# Patient Record
Sex: Male | Born: 1940 | Race: White | Hispanic: No | Smoking: Never smoker
Health system: Southern US, Community
[De-identification: ages and names within clinical notes are randomized; demographics above are authoritative.]

## PROBLEM LIST (undated history)

## (undated) DIAGNOSIS — D509 Iron deficiency anemia, unspecified: Secondary | ICD-10-CM

## (undated) DIAGNOSIS — R399 Unspecified symptoms and signs involving the genitourinary system: Secondary | ICD-10-CM

## (undated) DIAGNOSIS — IMO0001 Reserved for inherently not codable concepts without codable children: Secondary | ICD-10-CM

## (undated) DIAGNOSIS — L299 Pruritus, unspecified: Secondary | ICD-10-CM

## (undated) DIAGNOSIS — R310 Gross hematuria: Secondary | ICD-10-CM

## (undated) DIAGNOSIS — G250 Essential tremor: Secondary | ICD-10-CM

## (undated) DIAGNOSIS — R51 Headache: Secondary | ICD-10-CM

## (undated) DIAGNOSIS — K5730 Diverticulosis of large intestine without perforation or abscess without bleeding: Secondary | ICD-10-CM

## (undated) DIAGNOSIS — N200 Calculus of kidney: Secondary | ICD-10-CM

## (undated) DIAGNOSIS — D649 Anemia, unspecified: Secondary | ICD-10-CM

## (undated) DIAGNOSIS — N400 Enlarged prostate without lower urinary tract symptoms: Secondary | ICD-10-CM

## (undated) DIAGNOSIS — R300 Dysuria: Secondary | ICD-10-CM

## (undated) DIAGNOSIS — N3020 Other chronic cystitis without hematuria: Secondary | ICD-10-CM

## (undated) DIAGNOSIS — R519 Headache, unspecified: Secondary | ICD-10-CM

## (undated) DIAGNOSIS — G629 Polyneuropathy, unspecified: Secondary | ICD-10-CM

## (undated) DIAGNOSIS — I4510 Unspecified right bundle-branch block: Secondary | ICD-10-CM

## (undated) DIAGNOSIS — Z8719 Personal history of other diseases of the digestive system: Secondary | ICD-10-CM

## (undated) DIAGNOSIS — E119 Type 2 diabetes mellitus without complications: Secondary | ICD-10-CM

## (undated) DIAGNOSIS — N529 Male erectile dysfunction, unspecified: Secondary | ICD-10-CM

## (undated) DIAGNOSIS — Z8739 Personal history of other diseases of the musculoskeletal system and connective tissue: Secondary | ICD-10-CM

## (undated) DIAGNOSIS — N321 Vesicointestinal fistula: Secondary | ICD-10-CM

## (undated) DIAGNOSIS — E785 Hyperlipidemia, unspecified: Secondary | ICD-10-CM

## (undated) DIAGNOSIS — I10 Essential (primary) hypertension: Secondary | ICD-10-CM

## (undated) DIAGNOSIS — K219 Gastro-esophageal reflux disease without esophagitis: Secondary | ICD-10-CM

## (undated) DIAGNOSIS — Z9189 Other specified personal risk factors, not elsewhere classified: Secondary | ICD-10-CM

## (undated) HISTORY — DX: Benign prostatic hyperplasia without lower urinary tract symptoms: N40.0

## (undated) HISTORY — DX: Male erectile dysfunction, unspecified: N52.9

## (undated) HISTORY — DX: Other chronic cystitis without hematuria: N30.20

## (undated) HISTORY — DX: Gastro-esophageal reflux disease without esophagitis: K21.9

## (undated) HISTORY — DX: Reserved for inherently not codable concepts without codable children: IMO0001

## (undated) HISTORY — DX: Polyneuropathy, unspecified: G62.9

## (undated) HISTORY — DX: Iron deficiency anemia, unspecified: D50.9

## (undated) HISTORY — DX: Essential (primary) hypertension: I10

## (undated) HISTORY — DX: Calculus of kidney: N20.0

## (undated) HISTORY — PX: CORNEAL TRANSPLANT: SHX108

---
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
## 1961-03-31 HISTORY — PX: EYE SURGERY: SHX253

## 1986-03-31 HISTORY — PX: OTHER SURGICAL HISTORY: SHX169

## 1999-03-18 ENCOUNTER — Encounter: Admission: RE | Admit: 1999-03-18 | Discharge: 1999-03-18 | Payer: Self-pay | Admitting: Family Medicine

## 1999-03-18 ENCOUNTER — Encounter: Payer: Self-pay | Admitting: Family Medicine

## 2000-02-01 ENCOUNTER — Encounter: Payer: Self-pay | Admitting: Emergency Medicine

## 2000-02-01 ENCOUNTER — Emergency Department (HOSPITAL_COMMUNITY): Admission: EM | Admit: 2000-02-01 | Discharge: 2000-02-02 | Payer: Self-pay | Admitting: Emergency Medicine

## 2000-02-14 ENCOUNTER — Encounter: Admission: RE | Admit: 2000-02-14 | Discharge: 2000-02-14 | Payer: Self-pay | Admitting: Urology

## 2000-02-14 ENCOUNTER — Encounter: Payer: Self-pay | Admitting: Urology

## 2000-02-27 ENCOUNTER — Encounter: Admission: RE | Admit: 2000-02-27 | Discharge: 2000-02-27 | Payer: Self-pay | Admitting: Urology

## 2000-02-27 ENCOUNTER — Encounter: Payer: Self-pay | Admitting: Urology

## 2000-03-03 ENCOUNTER — Encounter: Admission: RE | Admit: 2000-03-03 | Discharge: 2000-03-03 | Payer: Self-pay | Admitting: Urology

## 2000-03-03 ENCOUNTER — Encounter: Payer: Self-pay | Admitting: Urology

## 2002-12-29 ENCOUNTER — Encounter (INDEPENDENT_AMBULATORY_CARE_PROVIDER_SITE_OTHER): Payer: Self-pay | Admitting: *Deleted

## 2003-04-01 HISTORY — PX: ROTATOR CUFF REPAIR: SHX139

## 2003-11-09 ENCOUNTER — Encounter: Admission: RE | Admit: 2003-11-09 | Discharge: 2003-11-09 | Payer: Self-pay | Admitting: Family Medicine

## 2007-09-10 ENCOUNTER — Encounter: Admission: RE | Admit: 2007-09-10 | Discharge: 2007-09-10 | Payer: Self-pay | Admitting: Family Medicine

## 2009-06-22 ENCOUNTER — Inpatient Hospital Stay (HOSPITAL_COMMUNITY): Admission: EM | Admit: 2009-06-22 | Discharge: 2009-06-30 | Payer: Self-pay | Admitting: Emergency Medicine

## 2009-06-22 ENCOUNTER — Encounter (INDEPENDENT_AMBULATORY_CARE_PROVIDER_SITE_OTHER): Payer: Self-pay | Admitting: *Deleted

## 2009-06-22 ENCOUNTER — Encounter: Admission: RE | Admit: 2009-06-22 | Discharge: 2009-06-22 | Payer: Self-pay | Admitting: Internal Medicine

## 2009-06-28 ENCOUNTER — Encounter (INDEPENDENT_AMBULATORY_CARE_PROVIDER_SITE_OTHER): Payer: Self-pay | Admitting: *Deleted

## 2009-07-04 ENCOUNTER — Ambulatory Visit (HOSPITAL_COMMUNITY): Admission: RE | Admit: 2009-07-04 | Discharge: 2009-07-04 | Payer: Self-pay | Admitting: Surgery

## 2009-07-04 ENCOUNTER — Encounter (INDEPENDENT_AMBULATORY_CARE_PROVIDER_SITE_OTHER): Payer: Self-pay | Admitting: *Deleted

## 2009-07-17 ENCOUNTER — Telehealth: Payer: Self-pay | Admitting: Internal Medicine

## 2009-07-23 ENCOUNTER — Encounter (INDEPENDENT_AMBULATORY_CARE_PROVIDER_SITE_OTHER): Payer: Self-pay | Admitting: *Deleted

## 2009-07-25 ENCOUNTER — Encounter (INDEPENDENT_AMBULATORY_CARE_PROVIDER_SITE_OTHER): Payer: Self-pay | Admitting: *Deleted

## 2009-07-25 ENCOUNTER — Ambulatory Visit: Payer: Self-pay | Admitting: Internal Medicine

## 2009-08-03 ENCOUNTER — Ambulatory Visit: Payer: Self-pay | Admitting: Internal Medicine

## 2009-08-14 ENCOUNTER — Telehealth: Payer: Self-pay | Admitting: Internal Medicine

## 2009-08-30 ENCOUNTER — Encounter: Payer: Self-pay | Admitting: Internal Medicine

## 2009-12-04 ENCOUNTER — Encounter: Payer: Self-pay | Admitting: Internal Medicine

## 2009-12-06 ENCOUNTER — Ambulatory Visit (HOSPITAL_COMMUNITY): Admission: RE | Admit: 2009-12-06 | Discharge: 2009-12-06 | Payer: Self-pay | Admitting: Surgery

## 2010-05-02 NOTE — Discharge Summary (Signed)
Summary: Scott Nixon 3/25- 4/2 Abdominal Abcess   NAME:  Scott Nixon, Scott Nixon                ACCOUNT NO.:  [id number]      MEDICAL RECORD NO.:  [id number]          PATIENT TYPE:  INP      LOCATION:  5128                         FACILITY:  [facility]      PHYSICIAN:  Currie Paris, M.D.DATE OF BIRTH:  1940/05/26      DATE OF ADMISSION:  06/22/2009   DATE OF DISCHARGE:  06/30/2009                                  DISCHARGE SUMMARY      HISTORY OF PRESENT ILLNESS:  [name] is a pleasant 70 year old male   who presented with abdominal pain over the course of several days.  His   workup included a CT scan, which apparently showed evidence of   diverticular disease with an air-fluid pocket on the right side of the   abdomen consistent with possibly a sealed perforation.  Decision was   made to admit the patient for nonoperative observation with the   indication for possible surgical intervention if necessary.      SUMMARY OF HOSPITAL COURSE:  The patient was admitted on June 22, 2009,   was started on IV antibiotics.  He responded well and showed almost   immediate improvement of his abdominal pain over the first 24 hours.  He   was started on clear liquid diet, which gradually advanced to full   liquid diet.  His white blood cell count normalized after 48 hours of IV   antibiotics.  However, he persisted to have some right-sided abdominal   pain.  A repeat CT scan was ordered and performed on the June 27, 2009,   which showed an area adjacent to the cecum consistent with an apparent   abscess.  It did appear to be a little bit worse than his original   admission CT, and decision was made to percutaneously drained that   abscess.  The patient successfully underwent percutaneous drainage by   Interventional Radiology.  He was advanced to a low residue diet and   tolerated that well without increased pain.  His white blood cell count   remained normal.  At this point, we felt the patient was  stable for   discharge home with his drain.  He would go home on p.o. antibiotics   consisting of Augmentin and Flagyl and would follow up in our clinic in   approximately 1-2 weeks' time to see Dr. Jamey Nixon.      DISCHARGE DIAGNOSES:  Intra-abdominal abscess secondary to probable   perforated diverticulum versus though less likely possible appendix.   The patient does have significant diverticular disease, and it is felt   to favor that.      DISCHARGE MEDICATIONS:  Cipro and Flagyl 500 mg each, Cipro twice daily,   Flagyl 3 times daily for a minimum of 10 days.  We will also resume all   of his home medications as noted on the medication reconciliation.  He   will follow up with Dr. Jamey Nixon in about approximately 7-10 days time.  Scott El, PA-C         ______________________________   Currie Paris, M.D.         KB/MEDQ  D:  07/05/2009  T:  07/06/2009  Job:  [id number]      Electronically Signed by Scott Nixon  on 07/06/2009 10:53:22 AM   Electronically Signed by Cyndia Bent M.D. on 07/10/2009 12:08:37 PM

## 2010-05-02 NOTE — Procedures (Signed)
Summary: Colonoscopy  Victorino Dike)   Colonoscopy  Procedure date:  12/29/2002  Findings:      Results: Diverticulosis.       Location:  [facility].    Colonoscopy  Procedure date:  12/29/2002  Findings:      Results: Diverticulosis.       [facility]   [facility]   Patient Name: Donte, Lenzo MRN:  Procedure Procedures: Colonoscopy CPT: 04540.  Personnel: Endoscopist: Ulyess Mort, MD.  Referred By: Margrett Rud, MD.  Exam Location: Exam performed in Outpatient Clinic. Outpatient  Patient Consent: Procedure, Alternatives, Risks and Benefits discussed, consent obtained, from patient. Consent was obtained by the RN.  Indications Symptoms: Abdominal pain / bloating.  History  Pre-Exam Physical: Entire physical exam was normal.  Exam Exam: Extent of exam reached: Cecum, extent intended: Cecum.  The cecum was identified by IC valve. Patient position: on left side. Colon retroflexion performed. ASA Classification: II.  Monitoring: Pulse and BP monitoring, Oximetry used. Supplemental O2 given.  Colon Prep Used Golytely for colon prep.  Sedation Meds: Patient assessed and found to be appropriate for moderate (conscious) sedation. Fentanyl given IV. Versed given IV.  Findings - DIVERTICULOSIS: Hepatic Flexure to Sigmoid Colon. ICD9: Diverticulosis: 562.10. Comments: mild --moderate.   Assessment Abnormal examination, see findings above.  Diagnoses: 562.10: Diverticulosis.   Events  Unplanned Interventions: No intervention was required.  Unplanned Events: There were no complications. Plans Medication Plan: Continue current medications.  Patient Education: Patient given standard instructions for: Diverticulosis. Yearly hemoccult testing recommended. Patient instructed to get routine colonoscopy every 5 years.  Disposition: After procedure patient sent to recovery. After recovery patient sent home.    This report  was created from the original endoscopy report, which was reviewed and signed by the above listed endoscopist.

## 2010-05-02 NOTE — Miscellaneous (Signed)
Summary: [facility] Previsit/prep  Clinical Lists Changes  Medications: Added new medication of MOVIPREP 100 GM  SOLR (PEG-KCL-NACL-NASULF-NA ASC-C) As per prep instructions. - Signed Rx of MOVIPREP 100 GM  SOLR (PEG-KCL-NACL-NASULF-NA ASC-C) As per prep instructions.;  #1 x 0;  Signed;  Entered by: [name] RN;  Authorized by: Iva Boop MD, [facility];  Method used: Electronically to [facility] [address] [place], [place]  [address], Ph: [phone], Fax: [phone] Observations: Added new observation of NKA: T (07/25/2009 8:15)    Prescriptions: MOVIPREP 100 GM  SOLR (PEG-KCL-NACL-NASULF-NA ASC-C) As per prep instructions.  #1 x 0   Entered by:   [name] RN   Authorized by:   Iva Boop MD, [facility]   Signed by:   [name] RN on 07/25/2009   Method used:   Electronically to        [facility].* (retail)       [address]       [place], [place]  [address]       Ph: [phone]       Fax: [phone]   RxID:   [id number]

## 2010-05-02 NOTE — Letter (Signed)
Summary: Diabetic Instructions  [facility]  [address] [place], [place] [address]   Phone: [phone]  Fax: [phone]    Scott Nixon 01/30/1941 MRN: [id number]   _ x _   ORAL DIABETIC MEDICATION INSTRUCTIONS  The day before your procedure:   Take your diabetic pill as you do normally  The day of your procedure:   Do not take your diabetic pill    We will check your blood sugar levels during the admission process and again in Recovery before discharging you home  ________________________________________________________________________

## 2010-05-02 NOTE — Procedures (Signed)
Summary: Colonoscopy  Patient: [name] Note: All result statuses are Final unless otherwise noted.  Tests: (1) Colonoscopy (COL)   COL Colonoscopy           DONE      [facility]     [address]     [place], [place]  [address]           COLONOSCOPY PROCEDURE REPORT           PATIENT:  Scott Nixon, Scott Nixon  MR#:  [id number]     BIRTHDATE:  Jan 03, 1941, 69 yrs. old  GENDER:  male     ENDOSCOPIST:  Iva Boop, MD, [facility]     REF. BY:  Cyndia Bent, M.D.     PROCEDURE DATE:  08/03/2009     PROCEDURE:  Colonoscopy with biopsy     ASA CLASS:  Class II     INDICATIONS:  abnormal CT of abdomen recent RLQ abscess with     spontaneous perforation, treated and healed with anibiotics and     drain     MEDICATIONS:   Fentanyl 75 mcg IV, Versed 8 mg IV           DESCRIPTION OF PROCEDURE:   After the risks benefits and     alternatives of the procedure were thoroughly explained, informed     consent was obtained.  Digital rectal exam was performed and     revealed no abnormalities and normal prostate.   The LB CF-H180AL     [id number] endoscope was introduced through the anus and advanced to     the cecum, which was identified by both the appendix and ileocecal     valve, without limitations.  The quality of the prep was     excellent, using MoviPrep.  The instrument was then slowly     withdrawn as the colon was fully examined.     Insertion: 4:32 minutes Withdrawal: 10:29 minutes     <<PROCEDUREIMAGES>>           FINDINGS:  Submucosal lesion in the cecum. Bulging mucosa (soft     and indentable) in cecum near appendix with smaller, firm     submucosal bulge (1cm) associated. Multiple biopsies were obtained     and sent to pathology. Tunnel biopsies x 4 of firm bulge.  Severe     diverticulosis was found in the left colon.  This was otherwise a     normal examination of the colon. Could see IC valve but distorted     cecum did not allow identification of ileal orifice.    Retroflexed     views in the rectum revealed internal hemorrhoids.    The scope     was then withdrawn from the patient and the procedure completed.           COMPLICATIONS:  None     ENDOSCOPIC IMPRESSION:     1) Submucosal lesion in cecum near appendix  with one cm firm     area on top - biopsiesd     2) Severe diverticulosis in the left colon     3) Internal hemorrhoids     4) Otherwise normal examination, excellent prep     RECOMMENDATIONS:     1) Await biopsy results     REPEAT EXAM:  In for Colonoscopy, pending biopsy results.           Iva Boop, MD, Clementeen Graham  CC:  Cyndia Bent, MD     Merlene Laughter, MD     The Patient           n.     eSIGNED:   Iva Boop at 08/03/2009 11:53 AM           Glynn Octave, [id number]  Note: An exclamation mark (!) indicates a result that was not dispersed into the flowsheet. Document Creation Date: 08/03/2009 11:53 AM _______________________________________________________________________  (1) Order result status: Final Collection or observation date-time: 08/03/2009 11:40 Requested date-time:  Receipt date-time:  Reported date-time:  Referring Physician:   Ordering Physician: Stan Head [id number]) Specimen Source:  Source: Launa Grill Order Number: [id number] Lab site:   Appended Document: Colonoscopy     Procedures Next Due Date:    Colonoscopy: 07/2016

## 2010-05-02 NOTE — Initial Assessments (Signed)
Summary: [facility] - Abdominal Abcess  NAME:  Scott Nixon, Scott Nixon                ACCOUNT NO.:  [id number]      MEDICAL RECORD NO.:  [id number]          PATIENT TYPE:  INP      LOCATION:  5127                         FACILITY:  [facility]      PHYSICIAN:  [name], MD     DATE OF BIRTH:  16-May-1940      DATE OF ADMISSION:  06/22/2009   DATE OF DISCHARGE:                                 HISTORY & PHYSICAL      PRIMARY CARE PHYSICIAN:  Hal T. Stoneking, MD      CHIEF COMPLAINTS:  Abdominal pain.      HISTORY OF PRESENT ILLNESS:  Scott Nixon is a 70 year old white male with   a history of diabetes mellitus and hypertension, who developed abdominal   pain this last Sunday on [place] way [place] the beach.  The patient did develop   some nausea on Sunday but has not had any since then.  On Monday, [place]   [place] he was   diagnosed with diverticulitis and placed on Cipro and Flagyl.  At that   time, the patient was told that if there are beds available that they   would have admitted him, but due to lack of beds, he was sent home.  The   patient continued to stay at the beach despite worsening pain and   returned home on Wednesday.  He was seen by Dr. Nehemiah Settle in the office on   Thursday and sent for a CT scan.  The CT scan was performed today as he   got to the outpatient Radiology facility to wait yesterday.  Today, CT   scan revealed pneumoperitoneum with a large area of free air near the   sigmoid colon with an abscess developing over the dome of the bladder.   Due to these findings, we were asked to evaluate the patient for   admission.      REVIEW OF SYSTEMS:  Please see HPI, otherwise, currently all other   systems are negative.      PAST MEDICAL HISTORY:   1. Diabetes mellitus.   2. Hypertension.      FAMILY HISTORY:  Noncontributory.      PAST SURGICAL HISTORY:   1. Appendectomy.   2. Right inguinal hernia repair.   3. Eye surgery.        SOCIAL HISTORY:  The patient has a significant other who I presume is   like his common-law wife as they are not married but have been together   for the last 15 years.  He denies tobacco or alcohol.  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      ALLERGIES:  No known drug allergies.      MEDICATIONS:  The patient is unaware of his medication doses; however,   recently, he has been on:   1. Cipro 500 mg 1 p.o. b.i.d.   2. Flagyl 500 mg 1 p.o. t.i.d.  3. Metformin dose unknown.   4. Flomax dose unknown.   5. Zetia dose unknown.   6. Blood pressure medicines for which he does not know the name.       However, his wife is supposed to bring back his medications on her       way back to the hospital from her house.      PHYSICAL EXAMINATION:  GENERAL:  Scott Nixon is a very pleasant 69-year-   old obese white male who is currently lying in bed, in no acute   distress.   VITAL SIGNS:  Temperature 97.7, pulse 73, respirations 22, blood   pressure 119/75.   HEENT:  Head is normocephalic and atraumatic.  Sclerae noninjected.   Pupils are equal, round, and reactive to light.  Ears and nose without   any obvious masses or lesions.  No rhinorrhea.  Mouth is pink and moist.   Throat shows no exudate.   HEART:  Regular rate and rhythm.  Normal S1 and S2.  No murmurs,   gallops, or rubs are noted.  A +2 carotid, radial, and pedal pulses   bilaterally.   LUNGS:  Clear to auscultation bilaterally with no wheezes, rhonchi, or   rales noted.  Respiratory effort is nonlabored.   ABDOMEN:  Soft with minimal tenderness throughout.  He does have active   bowel sounds and is somewhat distended.  Currently, he does not have any   peritoneal signs, guarding, or rebounding noted.  He does not have any   masses, hernias, or organomegaly noted.   MUSCULOSKELETAL:  All 4 extremities are symmetrical with no cyanosis,   clubbing, or edema.   NEUROLOGIC:  Cranial nerves  II through XII appear to be grossly intact.   Deep tendon reflex exam is deferred at this time.   PSYCH:  The patient is alert and oriented x3 with an appropriate affect.      LABORATORY DATA AND DIAGNOSTICS:  White blood cell count is 10,800,   currently a CMET is pending.   CT scan of the abdomen and pelvis reveals pneumoperitoneum with a large   pocket of air next to his sigmoid colon with multiple diverticula noted,   consistent with diverticulitis.  He does have an abscess that has formed   at the dome of the bladder.  However, it appears the patient may have   sealed his perforation as there is currently no contrast extravasation.      IMPRESSION:   1. Perforated diverticulitis with pneumoperitoneum and abscess       formation.   2. Diabetes mellitus.   3. Hypertension.      PLAN:  At this time, we will admit the patient and follow closely due to   his significant CT scan findings.  However, currently, the patient is in   no acute distress and appears he may have sealed his perforation off.   In the meantime, the patient will be placed on Invanz, as well as   various p.r.n. medications as needed for pain and nausea.  We will   follow him closely with serial exams, repeat labs in the morning.  The   hope due to the patient's debility will be to treat him conservatively;   however, with this patient, there is a low threshold for surgical   intervention due to his significant CT scan findings.               Letha Cape, PA  ______________________________   [name], MD         KEO/MEDQ  D:  06/22/2009  T:  06/23/2009  Job:  [id number]      cc:   Deirdre Peer. Polite, M.D.   Hal T. Stoneking, M.D.

## 2010-05-02 NOTE — Progress Notes (Signed)
Summary: call to Dr. Jamey Ripa and patient re: colonoscopy results  Phone Note Outgoing Call   Call placed by: Iva Boop MD, Clementeen Graham,  Aug 14, 2009 1:28 PM Summary of Call: reviewed colonoscopy findings with Dr. Jamey Ripa 1) We both think unlikely that cancer present 2) Dr. Jamey Ripa will see the patient in follow-up and consider repeat imaging, if needed 3) please ensure photos get to Dr. Jamey Ripa 4) Remind patient to schedule f/u appt with Dr. Jamey Ripa  Follow-up for Phone Call        Patient  has a follow up appointment wiht Dr Jamey Ripa 08/30/09.  he is aware of the appointment and results. Follow-up by: Darcey Nora RN, CGRN,  Aug 14, 2009 2:38 PM

## 2010-05-02 NOTE — Letter (Signed)
Summary: [facility]   Imported By: Lester Glenside 12/14/2009 08:47:03  _____________________________________________________________________  External Attachment:    Type:   Image     Comment:   External Document

## 2010-05-02 NOTE — Progress Notes (Signed)
Summary: discuss pt  Phone Note From Other Clinic Call back at [phone]   Caller: Dr. Jamey Ripa Call For: Dr. Leone Payor Summary of Call: would like pt to have COL, but has some GI health issues that Dr. Jamey Ripa would like to discuss with Dr. Leone Payor before sch'ling pt... Dr. Jamey Ripa will be available until 3:30PM at listed number Initial call taken by: Vallarie Mare,  July 17, 2009 11:05 AM  Follow-up for Phone Call        I called office but did not reach him yet rlq abscess, had been dx with perf in [facility] and sent back to [place] now recovered from abscess and not clear what initial event was Dr. Jamey Ripa and I think he needs a colonoscopy he may not need an office visit we will look for scans and hosp records then decide next step, also need paper chart Follow-up by: Iva Boop MD, Clementeen Graham,  July 19, 2009 9:18 AM  Additional Follow-up for Phone Call Additional follow up Details #1::        Paper chart ordered to your desk.  Hospital records copied into EMR Additional Follow-up by: Darcey Nora RN, CGRN,  July 19, 2009 9:36 AM    Additional Follow-up for Phone Call Additional follow up Details #2::    records reviewed I think he could be scheduled directly for a colonoscopy if he is ok with that otherwise rev me or extender first dx is 562.11 and 793.4 please copy/paste prior colonoscopy from cori Iva Boop MD, [facility]  July 20, 2009 3:42 PM   Additional Follow-up for Phone Call Additional follow up Details #3:: Details for Additional Follow-up Action Taken: Called pt, L/M to return call back to [place] Additional Follow-up by: Merri Ray CMA Duncan Dull),  July 23, 2009 9:58 AM  Patient  returned call he is scheduled to see pre-visit nurse 07/25/09 8:30 and colon [facility] 08-03-09 11:00 Darcey Nora RN, [facility]  July 23, 2009 2:48 PM

## 2010-05-02 NOTE — Letter (Signed)
Summary: [facility] Instructions  [facility]  [address] [place], [place] [address]   Phone: [phone]  Fax: [phone]       Scott Nixon    July 01, 1940    MRN: [id number]        Procedure Day /Date:  Friday 08/03/09     Arrival Time: 10:00 a.m.     Procedure Time: 11:00 a.m.     Location of Procedure:                    _x_  [facility] (4th Floor)                        PREPARATION FOR COLONOSCOPY WITH MOVIPREP   Starting 5 days prior to your procedure  07/29/09 do not eat nuts, seeds, popcorn, corn, beans, peas,  salads, or any raw vegetables.  Do not take any fiber supplements (e.g. Metamucil, Citrucel, and Benefiber).  THE DAY BEFORE YOUR PROCEDURE         DATE:  08/02/09  DAY:  Thursday  1.  Drink clear liquids the entire day-NO SOLID FOOD  2.  Do not drink anything colored red or purple.  Avoid juices with pulp.  No orange juice.  3.  Drink at least 64 oz. (8 glasses) of fluid/clear liquids during the day to prevent dehydration and help the prep work efficiently.  CLEAR LIQUIDS INCLUDE: Water Jello Ice Popsicles Tea (sugar ok, no milk/cream) Powdered fruit flavored drinks Coffee (sugar ok, no milk/cream) Gatorade Juice: apple, white grape, white cranberry  Lemonade Clear bullion, consomm, broth Carbonated beverages (any kind) Strained chicken noodle soup Hard Candy                             4.  In the morning, mix first dose of MoviPrep solution:    Empty 1 Pouch A and 1 Pouch B into the disposable container    Add lukewarm drinking water to the top line of the container. Mix to dissolve    Refrigerate (mixed solution should be used within 24 hrs)  5.  Begin drinking the prep at 5:00 p.m. The MoviPrep container is divided by 4 marks.   Every 15 minutes drink the solution down to the next mark (approximately 8 oz) until the full liter is complete.   6.  Follow completed prep with 16 oz of clear liquid of your choice  (Nothing red or purple).  Continue to drink clear liquids until bedtime.  7.  Before going to bed, mix second dose of MoviPrep solution:    Empty 1 Pouch A and 1 Pouch B into the disposable container    Add lukewarm drinking water to the top line of the container. Mix to dissolve    Refrigerate  THE DAY OF YOUR PROCEDURE      DATE:  08/03/09  DAY:  Friday  Beginning at  6:00 a.m. (5 hours before procedure):         1. Every 15 minutes, drink the solution down to the next mark (approx 8 oz) until the full liter is complete.  2. Follow completed prep with 16 oz. of clear liquid of your choice.    3. You may drink clear liquids until  9:00 a.m. (2 HOURS BEFORE PROCEDURE).   MEDICATION INSTRUCTIONS  Unless otherwise instructed, you should take regular prescription medications with a small sip of water  as early as possible the morning of your procedure.   Additional medication instructions:   Take blood pressure medicine the morning of procedure.         OTHER INSTRUCTIONS  You will need a responsible adult at least 70 years of age to accompany you and drive you home.   This person must remain in the waiting room during your procedure.  Wear loose fitting clothing that is easily removed.  Leave jewelry and other valuables at home.  However, you may wish to bring a book to read or  an iPod/MP3 player to listen to music as you wait for your procedure to start.  Remove all body piercing jewelry and leave at home.  Total time from sign-in until discharge is approximately 2-3 hours.  You should go home directly after your procedure and rest.  You can resume normal activities the  day after your procedure.  The day of your procedure you should not:   Drive   Make legal decisions   Operate machinery   Drink alcohol   Return to work  You will receive specific instructions about eating, activities and medications before you leave.    The above instructions have  been reviewed and explained to me by   [name] RN  July 25, 2009 8:53 AM     I fully understand and can verbalize these instructions _____________________________ Date _________

## 2010-05-03 NOTE — Letter (Signed)
Summary: [facility]   Imported By: Sherian Rein 01/16/2010 11:48:23  _____________________________________________________________________  External Attachment:    Type:   Image     Comment:   External Document

## 2010-05-03 NOTE — Letter (Signed)
Summary: [facility]   Imported By: Lester Erick 09/18/2009 09:11:11  _____________________________________________________________________  External Attachment:    Type:   Image     Comment:   External Document

## 2010-06-18 LAB — GLUCOSE, CAPILLARY: Glucose-Capillary: 104 mg/dL — ABNORMAL HIGH (ref 70–99)

## 2010-06-19 LAB — GLUCOSE, CAPILLARY
Glucose-Capillary: 106 mg/dL — ABNORMAL HIGH (ref 70–99)
Glucose-Capillary: 112 mg/dL — ABNORMAL HIGH (ref 70–99)
Glucose-Capillary: 88 mg/dL (ref 70–99)
Glucose-Capillary: 97 mg/dL (ref 70–99)

## 2010-06-19 LAB — CBC
Hemoglobin: 10.2 g/dL — ABNORMAL LOW (ref 13.0–17.0)
MCHC: 33.1 g/dL (ref 30.0–36.0)
MCV: 81.9 fL (ref 78.0–100.0)
Platelets: 383 10*3/uL (ref 150–400)
RDW: 17.9 % — ABNORMAL HIGH (ref 11.5–15.5)

## 2010-06-21 LAB — DIFFERENTIAL
Basophils Absolute: 0 10*3/uL (ref 0.0–0.1)
Monocytes Absolute: 0.6 10*3/uL (ref 0.1–1.0)
Monocytes Relative: 5 % (ref 3–12)
Neutro Abs: 8.5 10*3/uL — ABNORMAL HIGH (ref 1.7–7.7)

## 2010-06-21 LAB — URINALYSIS, ROUTINE W REFLEX MICROSCOPIC
Bilirubin Urine: NEGATIVE
Glucose, UA: NEGATIVE mg/dL
Hgb urine dipstick: NEGATIVE
Ketones, ur: NEGATIVE mg/dL
Nitrite: NEGATIVE
Protein, ur: NEGATIVE mg/dL
pH: 5 (ref 5.0–8.0)

## 2010-06-21 LAB — BASIC METABOLIC PANEL
CO2: 25 mEq/L (ref 19–32)
CO2: 26 mEq/L (ref 19–32)
Calcium: 7.8 mg/dL — ABNORMAL LOW (ref 8.4–10.5)
Calcium: 8.4 mg/dL (ref 8.4–10.5)
Chloride: 104 mEq/L (ref 96–112)
Creatinine, Ser: 1.01 mg/dL (ref 0.4–1.5)
GFR calc Af Amer: >60 mL/min (ref >60)
GFR calc Af Amer: >60 mL/min (ref >60)
Potassium: 4.3 mEq/L (ref 3.5–5.1)
Sodium: 136 mEq/L (ref 135–145)

## 2010-06-21 LAB — GLUCOSE, CAPILLARY
Glucose-Capillary: 103 mg/dL — ABNORMAL HIGH (ref 70–99)
Glucose-Capillary: 104 mg/dL — ABNORMAL HIGH (ref 70–99)
Glucose-Capillary: 104 mg/dL — ABNORMAL HIGH (ref 70–99)
Glucose-Capillary: 106 mg/dL — ABNORMAL HIGH (ref 70–99)
Glucose-Capillary: 108 mg/dL — ABNORMAL HIGH (ref 70–99)
Glucose-Capillary: 109 mg/dL — ABNORMAL HIGH (ref 70–99)
Glucose-Capillary: 110 mg/dL — ABNORMAL HIGH (ref 70–99)
Glucose-Capillary: 111 mg/dL — ABNORMAL HIGH (ref 70–99)
Glucose-Capillary: 113 mg/dL — ABNORMAL HIGH (ref 70–99)
Glucose-Capillary: 115 mg/dL — ABNORMAL HIGH (ref 70–99)
Glucose-Capillary: 122 mg/dL — ABNORMAL HIGH (ref 70–99)
Glucose-Capillary: 123 mg/dL — ABNORMAL HIGH (ref 70–99)
Glucose-Capillary: 130 mg/dL — ABNORMAL HIGH (ref 70–99)
Glucose-Capillary: 132 mg/dL — ABNORMAL HIGH (ref 70–99)
Glucose-Capillary: 134 mg/dL — ABNORMAL HIGH (ref 70–99)
Glucose-Capillary: 161 mg/dL — ABNORMAL HIGH (ref 70–99)
Glucose-Capillary: 250 mg/dL — ABNORMAL HIGH (ref 70–99)
Glucose-Capillary: 91 mg/dL (ref 70–99)

## 2010-06-21 LAB — CREATININE, SERUM
Creatinine, Ser: 0.99 mg/dL (ref 0.4–1.5)
GFR calc Af Amer: >60 mL/min (ref >60)

## 2010-06-21 LAB — CBC
HCT: 30.9 % — ABNORMAL LOW (ref 39.0–52.0)
HCT: 31.7 % — ABNORMAL LOW (ref 39.0–52.0)
Hemoglobin: 10.5 g/dL — ABNORMAL LOW (ref 13.0–17.0)
MCHC: 33.1 g/dL (ref 30.0–36.0)
MCHC: 33.6 g/dL (ref 30.0–36.0)
MCHC: 33.8 g/dL (ref 30.0–36.0)
MCHC: 33.9 g/dL (ref 30.0–36.0)
MCV: 80.7 fL (ref 78.0–100.0)
MCV: 80.8 fL (ref 78.0–100.0)
MCV: 81.8 fL (ref 78.0–100.0)
Platelets: 300 10*3/uL (ref 150–400)
RBC: 3.89 MIL/uL — ABNORMAL LOW (ref 4.22–5.81)
RBC: 3.89 MIL/uL — ABNORMAL LOW (ref 4.22–5.81)
RBC: 3.93 MIL/uL — ABNORMAL LOW (ref 4.22–5.81)
RDW: 17.7 % — ABNORMAL HIGH (ref 11.5–15.5)
RDW: 17.7 % — ABNORMAL HIGH (ref 11.5–15.5)
RDW: 17.9 % — ABNORMAL HIGH (ref 11.5–15.5)
WBC: 10.8 10*3/uL — ABNORMAL HIGH (ref 4.0–10.5)
WBC: 7.4 10*3/uL (ref 4.0–10.5)

## 2010-06-21 LAB — ANAEROBIC CULTURE

## 2010-06-21 LAB — COMPREHENSIVE METABOLIC PANEL
ALT: 28 U/L (ref 0–53)
AST: 36 U/L (ref 0–37)
Albumin: 2.9 g/dL — ABNORMAL LOW (ref 3.5–5.2)
BUN: 16 mg/dL (ref 6–23)
Calcium: 8.2 mg/dL — ABNORMAL LOW (ref 8.4–10.5)
Chloride: 100 mEq/L (ref 96–112)
Creatinine, Ser: 1.12 mg/dL (ref 0.4–1.5)
GFR calc non Af Amer: >60 mL/min (ref >60)
Potassium: 3.6 mEq/L (ref 3.5–5.1)
Total Protein: 7 g/dL (ref 6.0–8.3)

## 2010-06-21 LAB — PROTIME-INR: Prothrombin Time: 13.7 seconds (ref 11.6–15.2)

## 2010-06-21 LAB — APTT: aPTT: 30 seconds (ref 24–37)

## 2010-06-21 LAB — HEMOGLOBIN A1C: Mean Plasma Glucose: 134 mg/dL

## 2013-02-17 DIAGNOSIS — H02839 Dermatochalasis of unspecified eye, unspecified eyelid: Secondary | ICD-10-CM | POA: Insufficient documentation

## 2013-02-17 DIAGNOSIS — H02059 Trichiasis without entropian unspecified eye, unspecified eyelid: Secondary | ICD-10-CM | POA: Insufficient documentation

## 2013-02-17 DIAGNOSIS — H04559 Acquired stenosis of unspecified nasolacrimal duct: Secondary | ICD-10-CM | POA: Insufficient documentation

## 2013-02-17 DIAGNOSIS — H11239 Symblepharon, unspecified eye: Secondary | ICD-10-CM | POA: Insufficient documentation

## 2013-02-17 DIAGNOSIS — Z9889 Other specified postprocedural states: Secondary | ICD-10-CM | POA: Insufficient documentation

## 2013-05-02 DIAGNOSIS — G250 Essential tremor: Secondary | ICD-10-CM | POA: Diagnosis present

## 2013-05-02 DIAGNOSIS — I10 Essential (primary) hypertension: Secondary | ICD-10-CM | POA: Diagnosis present

## 2013-05-02 DIAGNOSIS — K219 Gastro-esophageal reflux disease without esophagitis: Secondary | ICD-10-CM | POA: Diagnosis present

## 2013-05-02 DIAGNOSIS — E119 Type 2 diabetes mellitus without complications: Secondary | ICD-10-CM

## 2013-09-12 ENCOUNTER — Ambulatory Visit (INDEPENDENT_AMBULATORY_CARE_PROVIDER_SITE_OTHER): Payer: Medicare Other

## 2013-09-12 ENCOUNTER — Ambulatory Visit (INDEPENDENT_AMBULATORY_CARE_PROVIDER_SITE_OTHER): Payer: Medicare Other | Admitting: Podiatry

## 2013-09-12 ENCOUNTER — Encounter: Payer: Self-pay | Admitting: Podiatry

## 2013-09-12 DIAGNOSIS — R52 Pain, unspecified: Secondary | ICD-10-CM

## 2013-09-12 DIAGNOSIS — M109 Gout, unspecified: Secondary | ICD-10-CM

## 2013-09-12 DIAGNOSIS — R609 Edema, unspecified: Secondary | ICD-10-CM

## 2013-09-12 DIAGNOSIS — M779 Enthesopathy, unspecified: Secondary | ICD-10-CM

## 2013-09-12 MED ORDER — TRIAMCINOLONE ACETONIDE 10 MG/ML IJ SUSP
10.0000 mg | Freq: Once | INTRAMUSCULAR | Status: AC
  Administered 2013-09-12: 10 mg

## 2013-09-12 NOTE — Progress Notes (Signed)
° °  Subjective:    Patient ID: Scott Nixon, male    DOB: 10-13-1940, 73 y.o.   MRN: [id number]  HPI my left ankle has been hurting me since last Thursday and I could not walk and is sore and tender and is swelling and is throbbing and numbness and tingling    Review of Systems  Constitutional: Positive for unexpected weight change.  Eyes: Positive for itching and visual disturbance.  Genitourinary: Positive for difficulty urinating.  Musculoskeletal: Positive for gait problem.       Joint pain   Neurological: Positive for tremors.  All other systems reviewed and are negative.      Objective:   Physical Exam        Assessment & Plan:

## 2013-09-12 NOTE — Progress Notes (Signed)
Subjective:     Patient ID: Scott Nixon, male   DOB: 12-Nov-1940, 73 y.o.   MRN: [id number]  HPI patient states he [place] [facility] [place] [facility] [place] and that the pain swollen and painful and he had trouble walking on it afterwards. Points to the left ankle mostly on the lateral side   Review of Systems  All other systems reviewed and are negative.      Objective:   Physical Exam  Nursing note and vitals reviewed. Constitutional: He is oriented to person, place, and time.  Cardiovascular: Intact distal pulses.   Musculoskeletal: Normal range of motion.  Neurological: He is oriented to person, place, and time.  Skin: Skin is warm.   neurovascular found to be intact with patient having pain in the peroneal complex left with +2 pitting edema noted in the forefoot and also ankle left. Negative Homan sign was noted in digits are found to be well perfused and arch height within normal limits     Assessment:     Probable tendinitis with inflammatory changes but cannot rule out systemic disease    Plan:     H&P and x-ray reviewed and injected along the peroneal area 3 mg Kenalog 5 mg I can Marcaine mixture and placed in Unna boot in order to compress the foot

## 2013-09-19 ENCOUNTER — Encounter: Payer: Medicare Other | Admitting: Podiatry

## 2013-09-22 ENCOUNTER — Encounter: Payer: Self-pay | Admitting: Podiatry

## 2013-09-22 ENCOUNTER — Ambulatory Visit (INDEPENDENT_AMBULATORY_CARE_PROVIDER_SITE_OTHER): Payer: Medicare Other | Admitting: Podiatry

## 2013-09-22 VITALS — BP 155/81 | HR 56 | Resp 17 | Ht 71.0 in | Wt 150.0 lb

## 2013-09-22 DIAGNOSIS — M7750 Other enthesopathy of unspecified foot: Secondary | ICD-10-CM

## 2013-09-22 NOTE — Progress Notes (Signed)
Subjective:     Patient ID: Scott Nixon, male   DOB: [birth year], 73 y.o.   MRN: [id number]  HPI patient states that the pain around the left ankle has improved and that he is walking a lot better now   Review of Systems     Objective:   Physical Exam Neurovascular status intact no change in health history with discomfort around the. Medial insertion improved quite well from previous visit    Assessment:     Doing well after being treated for tendinitis of the left peroneal insertion fifth metatarsal base    Plan:     Instructed patient on condition and discussed physical therapy and supportive shoe gear usage at this time. Patient will be seen back to recheck if symptoms persist

## 2014-01-27 ENCOUNTER — Emergency Department (HOSPITAL_COMMUNITY)
Admission: EM | Admit: 2014-01-27 | Discharge: 2014-01-27 | Disposition: A | Discharge status: Home / Self Care | Payer: Medicare Other | Attending: Emergency Medicine | Admitting: Emergency Medicine

## 2014-01-27 ENCOUNTER — Encounter: Payer: Self-pay | Admitting: Physician Assistant

## 2014-01-27 ENCOUNTER — Encounter (HOSPITAL_COMMUNITY): Payer: Self-pay | Admitting: Radiology

## 2014-01-27 ENCOUNTER — Emergency Department (HOSPITAL_COMMUNITY): Payer: Medicare Other

## 2014-01-27 DIAGNOSIS — K632 Fistula of intestine: Secondary | ICD-10-CM | POA: Diagnosis not present

## 2014-01-27 DIAGNOSIS — K5792 Diverticulitis of intestine, part unspecified, without perforation or abscess without bleeding: Secondary | ICD-10-CM

## 2014-01-27 DIAGNOSIS — E119 Type 2 diabetes mellitus without complications: Secondary | ICD-10-CM | POA: Diagnosis not present

## 2014-01-27 DIAGNOSIS — R1033 Periumbilical pain: Secondary | ICD-10-CM | POA: Diagnosis present

## 2014-01-27 DIAGNOSIS — K5732 Diverticulitis of large intestine without perforation or abscess without bleeding: Secondary | ICD-10-CM | POA: Insufficient documentation

## 2014-01-27 DIAGNOSIS — Z7982 Long term (current) use of aspirin: Secondary | ICD-10-CM | POA: Insufficient documentation

## 2014-01-27 DIAGNOSIS — N321 Vesicointestinal fistula: Secondary | ICD-10-CM

## 2014-01-27 DIAGNOSIS — Z79899 Other long term (current) drug therapy: Secondary | ICD-10-CM | POA: Insufficient documentation

## 2014-01-27 DIAGNOSIS — R109 Unspecified abdominal pain: Secondary | ICD-10-CM

## 2014-01-27 LAB — CBC WITH DIFFERENTIAL/PLATELET
BASOS PCT: 0 % (ref 0–1)
Basophils Absolute: 0 10*3/uL (ref 0.0–0.1)
Eosinophils Absolute: 0.2 10*3/uL (ref 0.0–0.7)
Eosinophils Relative: 3 % (ref 0–5)
HCT: 36.9 % — ABNORMAL LOW (ref 39.0–52.0)
HEMOGLOBIN: 12.2 g/dL — AB (ref 13.0–17.0)
Lymphocytes Relative: 14 % (ref 12–46)
Lymphs Abs: 1.3 10*3/uL (ref 0.7–4.0)
MCH: 29 pg (ref 26.0–34.0)
MCHC: 33.1 g/dL (ref 30.0–36.0)
MCV: 87.9 fL (ref 78.0–100.0)
MONO ABS: 0.7 10*3/uL (ref 0.1–1.0)
MONOS PCT: 7 % (ref 3–12)
NEUTROS ABS: 6.9 10*3/uL (ref 1.7–7.7)
Neutrophils Relative %: 76 % (ref 43–77)
Platelets: 164 10*3/uL (ref 150–400)
RBC: 4.2 MIL/uL — ABNORMAL LOW (ref 4.22–5.81)
RDW: 15 % (ref 11.5–15.5)
WBC: 9.1 10*3/uL (ref 4.0–10.5)

## 2014-01-27 LAB — URINALYSIS, ROUTINE W REFLEX MICROSCOPIC
BILIRUBIN URINE: NEGATIVE
Glucose, UA: NEGATIVE mg/dL
KETONES UR: NEGATIVE mg/dL
Nitrite: NEGATIVE
Protein, ur: NEGATIVE mg/dL
SPECIFIC GRAVITY, URINE: 1.014 (ref 1.005–1.030)
UROBILINOGEN UA: 0.2 mg/dL (ref 0.0–1.0)
pH: 5.5 (ref 5.0–8.0)

## 2014-01-27 LAB — COMPREHENSIVE METABOLIC PANEL
ALBUMIN: 3.5 g/dL (ref 3.5–5.2)
ALT: 31 U/L (ref 0–53)
ANION GAP: 14 (ref 5–15)
AST: 26 U/L (ref 0–37)
Alkaline Phosphatase: 59 U/L (ref 39–117)
BILIRUBIN TOTAL: 0.8 mg/dL (ref 0.3–1.2)
BUN: 31 mg/dL — ABNORMAL HIGH (ref 6–23)
CHLORIDE: 107 meq/L (ref 96–112)
CO2: 19 mEq/L (ref 19–32)
CREATININE: 1.63 mg/dL — AB (ref 0.50–1.35)
Calcium: 9.1 mg/dL (ref 8.4–10.5)
GFR, EST AFRICAN AMERICAN: 47 mL/min — AB (ref >90)
GFR, EST NON AFRICAN AMERICAN: 40 mL/min — AB (ref >90)
Glucose, Bld: 129 mg/dL — ABNORMAL HIGH (ref 70–99)
Potassium: 4.7 mEq/L (ref 3.7–5.3)
Sodium: 140 mEq/L (ref 137–147)
Total Protein: 7.1 g/dL (ref 6.0–8.3)

## 2014-01-27 LAB — URINE MICROSCOPIC-ADD ON

## 2014-01-27 LAB — LIPASE, BLOOD: LIPASE: 51 U/L (ref 11–59)

## 2014-01-27 MED ORDER — MORPHINE SULFATE 4 MG/ML IJ SOLN
4.0000 mg | Freq: Once | INTRAMUSCULAR | Status: AC
  Administered 2014-01-27: 4 mg via INTRAVENOUS
  Filled 2014-01-27: qty 1

## 2014-01-27 MED ORDER — INFLUENZA VAC SPLIT QUAD 0.5 ML IM SUSY: 0.5000 mL | PREFILLED_SYRINGE | INTRAMUSCULAR | Status: DC

## 2014-01-27 MED ORDER — IOHEXOL 300 MG/ML  SOLN
80.0000 mL | Freq: Once | INTRAMUSCULAR | Status: AC | PRN
  Administered 2014-01-27: 80 mL via INTRAVENOUS

## 2014-01-27 MED ORDER — METRONIDAZOLE 500 MG PO TABS: 500.0000 mg | ORAL_TABLET | Freq: Three times a day (TID) | ORAL | Status: DC

## 2014-01-27 MED ORDER — OXYCODONE-ACETAMINOPHEN 5-325 MG PO TABS: 1.0000 | ORAL_TABLET | Freq: Four times a day (QID) | ORAL | Status: DC | PRN

## 2014-01-27 MED ORDER — IOHEXOL 300 MG/ML  SOLN
50.0000 mL | Freq: Once | INTRAMUSCULAR | Status: AC | PRN
  Administered 2014-01-27: 50 mL via ORAL

## 2014-01-27 MED ORDER — CIPROFLOXACIN HCL 500 MG PO TABS: 500.0000 mg | ORAL_TABLET | Freq: Two times a day (BID) | ORAL | Status: DC

## 2014-01-27 MED ORDER — SODIUM CHLORIDE 0.9 % IV BOLUS (SEPSIS)
500.0000 mL | Freq: Once | INTRAVENOUS | Status: AC
  Administered 2014-01-27: 500 mL via INTRAVENOUS

## 2014-01-27 NOTE — ED Notes (Signed)
Pt c/o abdominal pain that started yesterday.

## 2014-01-27 NOTE — ED Provider Notes (Signed)
CSN: [id number]     Arrival date & time 01/27/14  [id number] History   First MD Initiated Contact with Patient 01/27/14 [phone]     No chief complaint on file.    (Consider location/radiation/quality/duration/timing/severity/associated sxs/prior Treatment) HPI Comments: Patient is a 73 year old male with history of diabetes and diverticulitis. He presents with complaints of periumbilical abdominal pain and distention that has been worsening since yesterday. He denies any fevers or chills. He denies any vomiting, diarrhea, or constipation. He reports to me he has been "passing air through his penis" and was seen by Dr. Isabel CapriceGrapey from urology for this. He underwent what sounds like a cystoscopy 2 days ago, the results of which are unavailable to me. His symptoms started the day after this procedure was performed. He denies any hematuria or dysuria.  Patient is a 73 y.o. male presenting with abdominal pain. The history is provided by the patient.  Abdominal Pain Pain location:  Periumbilical Pain quality: cramping   Pain radiates to:  Does not radiate Pain severity:  Moderate Onset quality:  Gradual Duration:  12 hours Timing:  Constant Progression:  Worsening Chronicity:  New Relieved by:  Nothing Worsened by:  Nothing tried Ineffective treatments:  None tried Associated symptoms: belching   Associated symptoms: no chills, no constipation, no fever and no vomiting     Past Medical History  Diagnosis Date  . Diabetes mellitus without complication    No past surgical history on file. No family history on file. History  Substance Use Topics  . Smoking status: Never Smoker   . Smokeless tobacco: Never Used  . Alcohol Use: No    Review of Systems  Constitutional: Negative for fever and chills.  Gastrointestinal: Positive for abdominal pain. Negative for vomiting and constipation.  All other systems reviewed and are negative.     Allergies  Review of patient's allergies indicates no  known allergies.  Home Medications   Prior to Admission medications   Medication Sig Start Date End Date Taking? Authorizing Provider  aspirin EC 81 MG tablet Take by mouth.    Historical Provider, MD  atenolol (TENORMIN) 25 MG tablet Take 25 mg by mouth. 04/24/13   Historical Provider, MD  ezetimibe (ZETIA) 10 MG tablet Take 10 mg by mouth. 01/24/13   Historical Provider, MD  metFORMIN (GLUCOPHAGE) 500 MG tablet  07/27/13   Historical Provider, MD  Multiple Vitamin (MULTIVITAMIN) tablet Take 1 tablet by mouth daily.    Historical Provider, MD  Omega-3 Fatty Acids (FISH OIL) 1200 MG CAPS Take by mouth.    Historical Provider, MD  tamsulosin (FLOMAX) 0.4 MG CAPS capsule  09/11/13   Historical Provider, MD  valsartan (DIOVAN) 320 MG tablet  08/10/13   Historical Provider, MD   BP 122/56  Pulse 62  Temp(Src) 97.7 F (36.5 C) (Oral)  Resp 18  SpO2 100% Physical Exam  Nursing note and vitals reviewed. Constitutional: He is oriented to person, place, and time. He appears well-developed and well-nourished. No distress.  HENT:  Head: Normocephalic and atraumatic.  Mouth/Throat: Oropharynx is clear and moist.  Neck: Normal range of motion. Neck supple.  Cardiovascular: Normal rate, regular rhythm and normal heart sounds.   No murmur heard. Pulmonary/Chest: Effort normal and breath sounds normal. No respiratory distress. He has no wheezes.  Abdominal: Soft. Bowel sounds are normal. He exhibits no distension. There is tenderness. There is no rebound and no guarding.  There is mild to moderate distention of the abdomen with tenderness to palpation  in the periumbilical region.  Musculoskeletal: Normal range of motion. He exhibits no edema.  Neurological: He is alert and oriented to person, place, and time.  Skin: Skin is warm and dry. He is not diaphoretic.    ED Course  Procedures (including critical care time) Labs Review Labs Reviewed  CBC WITH DIFFERENTIAL  COMPREHENSIVE METABOLIC PANEL   LIPASE, BLOOD  URINALYSIS, ROUTINE W REFLEX MICROSCOPIC    Imaging Review No results found.   EKG Interpretation None      MDM   Final diagnoses:  Abdominal pain    Patient presents with lower abdominal pain. He is been passing air through his penis and recently underwent a cystoscopy. 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 He presents today with increased lower abdominal pain that appears to be due to diverticulitis. This was noted on CAT scan today without abscess. He has no white count, no fever, and does not appear toxic.  I have discussed the case with Dr. Vernie Ammonsttelin from urology who is recommending follow-up with general surgery. I've spoken with the PA on call for general surgery who has notified the office and an appointment will be made as an outpatient. In the meantime he will be treated with Cipro, Flagyl, and pain medication. He is to return as needed if his symptoms worsen or change.    Geoffery Lyonsouglas Chandan Fly, MD 01/27/14 [phone]

## 2014-01-27 NOTE — Discharge Instructions (Signed)
Cipro and Flagyl as prescribed.  Percocet as prescribed as needed for pain.  Follow-up with general surgery. They will call you to arrange this appointment.  Return to the ER if you develop worsening pain, high fever, bloody stool, or other new and concerning symptoms.   Diverticulitis Diverticulitis is inflammation or infection of small pouches in your colon that form when you have a condition called diverticulosis. The pouches in your colon are called diverticula. Your colon, or large intestine, is where water is absorbed and stool is formed. Complications of diverticulitis can include:  Bleeding.  Severe infection.  Severe pain.  Perforation of your colon.  Obstruction of your colon. CAUSES  Diverticulitis is caused by bacteria. Diverticulitis happens when stool becomes trapped in diverticula. This allows bacteria to grow in the diverticula, which can lead to inflammation and infection. RISK FACTORS People with diverticulosis are at risk for diverticulitis. Eating a diet that does not include enough fiber from fruits and vegetables may make diverticulitis more likely to develop. SYMPTOMS  Symptoms of diverticulitis may include:  Abdominal pain and tenderness. The pain is normally located on the left side of the abdomen, but may occur in other areas.  Fever and chills.  Bloating.  Cramping.  Nausea.  Vomiting.  Constipation.  Diarrhea.  Blood in your stool. DIAGNOSIS  Your health care provider will ask you about your medical history and do a physical exam. You may need to have tests done because many medical conditions can cause the same symptoms as diverticulitis. Tests may include:  Blood tests.  Urine tests.  Imaging tests of the abdomen, including X-rays and CT scans. When your condition is under control, your health care provider may recommend that you have a colonoscopy. A colonoscopy can show how severe your diverticula are and whether something else is  causing your symptoms. TREATMENT  Most cases of diverticulitis are mild and can be treated at home. Treatment may include:  Taking over-the-counter pain medicines.  Following a clear liquid diet.  Taking antibiotic medicines by mouth for 7-10 days. More severe cases may be treated at a hospital. Treatment may include:  Not eating or drinking.  Taking prescription pain medicine.  Receiving antibiotic medicines through an IV tube.  Receiving fluids and nutrition through an IV tube.  Surgery. HOME CARE INSTRUCTIONS   Follow your health care provider's instructions carefully.  Follow a full liquid diet or other diet as directed by your health care provider. After your symptoms improve, your health care provider may tell you to change your diet. He or she may recommend you eat a high-fiber diet. Fruits and vegetables are good sources of fiber. Fiber makes it easier to pass stool.  Take fiber supplements or probiotics as directed by your health care provider.  Only take medicines as directed by your health care provider.  Keep all your follow-up appointments. SEEK MEDICAL CARE IF:   Your pain does not improve.  You have a hard time eating food.  Your bowel movements do not return to normal. SEEK IMMEDIATE MEDICAL CARE IF:   Your pain becomes worse.  Your symptoms do not get better.  Your symptoms suddenly get worse.  You have a fever.  You have repeated vomiting.  You have bloody or black, tarry stools. MAKE SURE YOU:   Understand these instructions.  Will watch your condition.  Will get help right away if you are not doing well or get worse. Document Released: 12/25/2004 Document Revised: 03/22/2013 Document Reviewed: 02/09/2013 Exit[employer]  Patient Information ©2015 [employer]. This information is not intended to replace advice given to you by your health care provider. Make sure you discuss any questions you have with your health care provider. ° °

## 2014-01-27 NOTE — ED Notes (Signed)
Pt unable to provide urine specimen at moment, had previously used bathroom will attempt to give in 10 minutes, specimen cup provided

## 2014-02-01 ENCOUNTER — Telehealth (INDEPENDENT_AMBULATORY_CARE_PROVIDER_SITE_OTHER): Payer: Self-pay

## 2014-02-01 ENCOUNTER — Other Ambulatory Visit (INDEPENDENT_AMBULATORY_CARE_PROVIDER_SITE_OTHER): Payer: Self-pay | Admitting: Surgery

## 2014-02-01 NOTE — H&P (Signed)
Scott Nixon 02/01/2014 10:47 AM Location: [facility] Patient #: [id number] DOB: 15-Jun-1940 Married / Language: [place] / Race: White Male History of Present Illness Scott Nixon(Nathanie Ottley C. Nefi Musich MD; 02/01/2014 1:38 PM) Patient words: diverticulitis.  The patient is a 73 year old male who presents with anal itching. Pleasant patient with history of RLQ abdominal pain intermittent last 10 years. h/o kidney stoins. Known diverticulosis. Had severe attack with abscess in 2011. Colonoscopy confirmed diverticulosis. Saw Dr. Jamey RipaStreck with our group 4 years ago. He has since retired. No surgery done. Sounds like the abscess was between the right colon and sigmoid colon. Thought to perhaps be appendiceal versus diverticular. Resolved after percutaneous drainage of abscess. Denies any problems in the past 4 years until the patient began to develop recurrent urinary tract infections. Persisted despite several rounds of oral antibiotics. Then began to have pneumaturia. CT scan showing inflammation of sigmoid colon on bladder with gas in bladder. Cystoscopy concerning for colovesical fistula. Surgical consultation requested. Patient had colonoscopy 2 times. Diverticulosis noted. Some bulging in cecum 4 years ago. Biopsies not consistent with polyp or cancer. Patient with new episode of pneumaturia & concern for colovesical fistula, Dr. Isabel CapriceGrapey with [facility] requested surgical evaluation. 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  Allergies (Sonya Bynum, CMA; 02/01/2014 10:49 AM) No Known Drug Allergies 02/01/2014  Medication History (Sonya Bynum, CMA; 02/01/2014 10:51 AM) Atenolol (25MG  Tablet, Oral) Active. MetFORMIN HCl (500MG   Tablet, Oral) Active. Tamsulosin HCl (0.4MG  Capsule, Oral) Active. MetroNIDAZOLE (500MG  Tablet, Oral) Active. Zetia (10MG  Tablet, Oral) Active. Valsartan (320MG  Tablet, Oral) Active. Aspirin EC (81MG  Tablet DR, Oral) Active. Tylenol Extra Strength (500MG  Tablet, Oral) Active. Aleve (220MG  Capsule, Oral) Active.  Social History Gilmer Mor(Sonya Bynum, CMA; 02/01/2014 10:57 AM) Alcohol use Occasional alcohol use. Caffeine use Coffee. No drug use Tobacco use Never smoker.  Family History Gilmer Mor(Sonya Bynum, CMA; 02/01/2014 10:57 AM) Alcohol Abuse Father. Diabetes Mellitus Family Members In General. Hypertension Family Members In General. Respiratory Condition Family Members In General, Mother.     Review of Systems Lamar Laundry(Sonya Bynum CMA; 02/01/2014 10:57 AM) General Present- Fatigue and Weight Loss. Not Present- Appetite Loss, Chills, Fever, Night Sweats and Weight Gain. Skin Not Present- Change in Wart/Mole, Dryness, Hives, Jaundice, New Lesions, Non-Healing Wounds, Rash and Ulcer. HEENT Present- Seasonal Allergies. Not Present- Earache, Hearing Loss, Hoarseness, Nose Bleed, Oral Ulcers, Ringing in the Ears, Sinus Pain, Sore Throat, Visual Disturbances, Wears glasses/contact lenses and Yellow Eyes. Respiratory Not Present- Bloody sputum, Chronic Cough, Difficulty Breathing, Snoring and Wheezing. Male Genitourinary Present- Change in Urinary Stream, Frequency, Urgency and Urine Leakage. Not Present- Blood in Urine, Impotence, Nocturia and Painful Urination. Neurological Present- Tremor. Not Present- Decreased Memory, Fainting, Headaches, Numbness, Seizures, Tingling, Trouble walking and Weakness.  Vitals (Sonya Bynum CMA; 02/01/2014 10:49 AM) 02/01/2014 10:48 AM Weight: 239 lb Height: 70in Body Surface Area: 2.31 m Body Mass Index: 34.29 kg/m Temp.: 97.93F(Temporal)  Pulse: 71 (Regular)  BP: 128/80 (Sitting, Left Arm, Standard)     Physical Exam Scott Nixon(Johnie Stadel C. Swain Acree MD;  02/01/2014 1:39 PM)  General Mental Status-Alert. General Appearance-Not in acute distress, Not Sickly. Orientation-Oriented X3. Hydration-Well hydrated. Voice-Normal.  Integumentary Global Assessment Upon inspection and palpation of skin surfaces of the - Axillae: non-tender, no inflammation or ulceration, no drainage. and Distribution of scalp and body hair is  normal. General Characteristics Temperature - normal warmth is noted.  Head and Neck Head-normocephalic, atraumatic with no lesions or palpable masses. Face Global Assessment - atraumatic, no absence of expression. Neck Global Assessment - no abnormal movements, no bruit auscultated on the right, no bruit auscultated on the left, no decreased range of motion, non-tender. Trachea-midline. Thyroid Gland Characteristics - non-tender.  Eye Eyeball - Left-Extraocular movements intact, No Nystagmus. Eyeball - Right-Extraocular movements intact, No Nystagmus. Cornea - Left-No Hazy. Cornea - Right-No Hazy. Sclera/Conjunctiva - Left-No scleral icterus, No Discharge. Sclera/Conjunctiva - Right-No scleral icterus, No Discharge. Pupil - Left-Direct reaction to light normal. Pupil - Right-Direct reaction to light normal.  ENMT Ears Pinna - Left - no drainage observed, no generalized tenderness observed. Right - no drainage observed, no generalized tenderness observed. Nose and Sinuses External Inspection of the Nose - no destructive lesion observed. Inspection of the nares - Left - quiet respiration. Right - quiet respiration. Mouth and Throat Lips - Upper Lip - no fissures observed, no pallor noted. Lower Lip - no fissures observed, no pallor noted. Nasopharynx - no discharge present. Oral Cavity/Oropharynx - Tongue - no dryness observed. Oral Mucosa - no cyanosis observed. Hypopharynx - no evidence of airway distress observed.  Chest and Lung Exam Inspection Movements - Normal and Symmetrical.  Accessory muscles - No use of accessory muscles in breathing. Palpation Palpation of the chest reveals - Non-tender. Auscultation Breath sounds - Normal and Clear.  Cardiovascular Auscultation Rhythm - Regular. Murmurs & Other Heart Sounds - Auscultation of the heart reveals - No Murmurs and No Systolic Clicks.  Abdomen Inspection Inspection of the abdomen reveals - No Visible peristalsis and No Abnormal pulsations. Umbilicus - No Bleeding, No Urine drainage. Palpation/Percussion Palpation and Percussion of the abdomen reveal - Soft, No Rebound tenderness, No Rigidity (guarding) and No Cutaneous hyperesthesia. Note: Obese but soft. Some discomfort in the lower abdomen. No guarding or peritonitis.  Male Genitourinary Sexual Maturity Tanner 5 - Adult hair pattern and Adult penile size and shape.  Peripheral Vascular Upper Extremity Inspection - Left - No Cyanotic nailbeds, Not Ischemic. Right - No Cyanotic nailbeds, Not Ischemic.  Neurologic Neurologic evaluation reveals -normal attention span and ability to concentrate, able to name objects and repeat phrases. Appropriate fund of knowledge , normal sensation and normal coordination. Mental Status Affect - not angry, not paranoid. Cranial Nerves-Normal Bilaterally. Gait-Normal.  Neuropsychiatric Mental status exam performed with findings of-able to articulate well with normal speech/language, rate, volume and coherence, thought content normal with ability to perform basic computations and apply abstract reasoning and no evidence of hallucinations, delusions, obsessions or homicidal/suicidal ideation. Note: Interrupts and jokes a lot. Mostly pleasant affect   Musculoskeletal Global Assessment Spine, Ribs and Pelvis - no instability, subluxation or laxity. Right Upper Extremity - no instability, subluxation or laxity.  Lymphatic Head & Neck  General Head & Neck Lymphatics: Bilateral - Description - No Localized  lymphadenopathy. Axillary  General Axillary Region: Bilateral - Description - No Localized lymphadenopathy. Femoral & Inguinal  Generalized Femoral & Inguinal Lymphatics: Left - Description - No Localized lymphadenopathy. Right - Description - No Localized lymphadenopathy.    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 Most likely etiology of perforation and abscess in 2011. Perhaps cause of flank pain 10 years ago.  He is going to require sigmoid colectomy to remove this problem area. Possible bladder repair. Reasonable  to try minimally invasive approach. I would have low threshold to remove his appendix as well since his abscess was near there four years ago to make sure that that is not a factor. Ask urology to place stents as well to better find the anatomy in this tricky situation  I would complete current PO antibiotics course. Probably 5 more days of Cipro/metronidazole of 2 weeks course. Then see how he does off antibiotics. If any recurrent symptoms, restart antibiotics and stay on that until he has surgery.  Keep appointment for repeat colonoscopy since last one was 4 years ago and question of abnormal cecal mass (submucosal lipoma?). Also make sure there is no other problems there.  Apparently they have tickets to go on a cruise and leave in mid January. Hoping to make that. I cautioned them that he may not be fully covered from surgery to do that but we will see.  Current Plans Schedule for Surgery Started Neomycin Sulfate 500MG , 2 (two) Tablet SEE NOTE, #6, 02/01/2014, No Refill. Local Order: TAKE TWO TABLETS AT 2 PM, 3 PM, AND 10 PM THE DAY PRIOR TO SURGERY Started Flagyl 500MG , 2 (two) Tablet SEE NOTE, #6, 02/01/2014, No Refill. Local Order: Take at 2pm, 3pm, and 10pm the day prior to your colon operation [facility] Consent - Colectomy (Omario Ander):  discussed with patient and provided information. Pt Education - [facility] Bowel Prep Pt Education - [facility] Good Bowel Health (Han Vejar) Pt Education - [facility] Abdominal Surgery (Genee Rann) Pt Education - [facility] Pain Control (Covey Baller) Instructions:  Keep appointment with gastroenterology to get colonoscopy.  At some point, you need to have her sigmoid colon removed and possibly part of your bladder to get rid of the recurrent diverticulitis and fix the fistula to your bladder.  Please see handouts as discussed

## 2014-02-01 NOTE — Telephone Encounter (Signed)
Pt seen by Dr Michaell CowingGross today for surgery evaluation to discuss colovesical fistula. Pt is going to need surgery by Dr Michaell CowingGross but needs to have colonoscopy before surgery. The pt has an appt with [facility] on Monday 11/9 to talk about getting the colonoscopy. I advised pt today in office that if he wants to have the colonoscopy the day before surgery that is fine but we will need to coordinate this with GI. I advised pt that he would only have to prep once if the colonoscopy was the day before sx. I advised pt that he could follow the prep instructions from GI but when he came home from the colonoscopy he would have to remain on clear liquids till midnight the night before sx. The pt was advised that he would only have to follow the clear liquids and take the prescribed abx's at the times we gave him on our prep instructions. The pt is fully aware. The pt will discuss this option with GI so they can call our surgery scheduler Kerrin ChampagneKim Sorrentino to coordinate the cases.

## 2014-02-02 ENCOUNTER — Encounter: Payer: Self-pay | Admitting: Gastroenterology

## 2014-02-06 ENCOUNTER — Encounter: Payer: Self-pay | Admitting: Physician Assistant

## 2014-02-06 ENCOUNTER — Ambulatory Visit (INDEPENDENT_AMBULATORY_CARE_PROVIDER_SITE_OTHER): Payer: Medicare Other | Admitting: Physician Assistant

## 2014-02-06 VITALS — Ht 67.72 in | Wt 240.1 lb

## 2014-02-06 DIAGNOSIS — N321 Vesicointestinal fistula: Secondary | ICD-10-CM | POA: Insufficient documentation

## 2014-02-06 NOTE — Patient Instructions (Signed)
We will be calling you once Dr. Leone PayorGessner speaks to Dr. Michaell CowingGross, the surgeon. We will coordinate with Dr. Michaell CowingGross regarding a colonoscopy and surgery.

## 2014-02-06 NOTE — Progress Notes (Signed)
Patient ID: Scott CreteGeorge H Nixon, male   DOB: 02/15/1941, 73 y.o.   MRN: [id number]    HPI:Scott Nixon is a 73 year old male referred by Dr. Merlene LaughterHal Stoneking for evaluation due to a recent finding of a colovesical fistula.  Scott Nixon is known to the practice has he had a colonoscopy in September 2004 by Dr. Rosiland OzSam Bauer which was normal. In 2011, he had abdominal pain and had a CT of the abdomen that revealed a right lower quadrant abscess with spontaneous perforation which was treated and healed with antibiotics and drain. He had a colonoscopy on 08/03/2009 that revealed a submucosal lesion in the cecum biopsies revealed benign colonic mucosa.  Scott Nixon states he was feeling well until this summer when he began to have repeated bouts of cystitis and pyuria he was evaluated at [facility] and had a CT that showed a nonobstructive 7 mm stone in the right kidney. There was evidence of diverticulosis, but no obvious intra-abdominal inflammation. His urine was markedly abnormal and culture was positive for Klebsiella. Put on a course of Septra and remains on suppressive therapy the patient had also experienced several bouts of pneumaturia. He had been noticing increased bubbles in his urine. He underwent a cystoscopy on October 28 and examination of the bladder demonstrated erythematous mucosa located near the dome of the bladder and edema located near the dome of the bladder. Scott Nixon may have a relatively small GI-GU fistula secondary to diverticular disease. He then had a CT of the abdomen and pelvis with contrast on 01/27/2014 that showed sigmoid diverticulosis with inflammatory changes around the sigmoid colon compatible with active diverticulitis. There is intermittent association of the sigmoid colon with the urinary bladder with gas in the bladder. Findings suggestive of colovesical fistula. Patient is to be scheduled for a surgical repair, but is here today because he needs to coordinate a colonoscopy  prior to surgery. He states his bowel movements have been regular. He's had no bright red blood per rectum and no melena.   Past Medical History  Diagnosis Date  . Diabetes mellitus without complication     metformin  . GERD (gastroesophageal reflux disease)   . Gout   . Hypercholesterolemia   . HTN (hypertension)   . Sleep apnea   . Diverticulosis   . Nephrolithiasis   . Chronic cystitis   . Abdominal hernia   . Colovesical fistula     Past Surgical History  Procedure Laterality Date  . Right cornea transplant    . Hernia repair    . Eye surgery    . Left shoulder surgery     Family History  Problem Relation Age of Onset  . Heart failure Father   . Emphysema Mother    History  Substance Use Topics  . Smoking status: Never Smoker   . Smokeless tobacco: Never Used  . Alcohol Use: No   Current Outpatient Prescriptions  Medication Sig Dispense Refill  . aspirin EC 81 MG tablet Take 81 mg by mouth daily.     Marland Kitchen. atenolol (TENORMIN) 25 MG tablet Take 25 mg by mouth daily.     Marland Kitchen. esomeprazole (NEXIUM) 40 MG packet Take 40 mg by mouth as needed.    . ezetimibe (ZETIA) 10 MG tablet Take 10 mg by mouth daily.     Marland Kitchen. ibuprofen (ADVIL,MOTRIN) 200 MG tablet Take 400 mg by mouth every 6 (six) hours as needed for mild pain or moderate pain (gout in big toe).    .Marland Kitchen  metFORMIN (GLUCOPHAGE) 500 MG tablet Take 1,000 mg by mouth daily with breakfast.     . metoprolol (LOPRESSOR) 50 MG tablet Take 50 mg by mouth daily.    . metroNIDAZOLE (FLAGYL) 500 MG tablet Take 1 tablet (500 mg total) by mouth 3 (three) times daily. One po bid x 7 days 21 tablet 0  . Multiple Vitamin (MULTIVITAMIN) tablet Take 1 tablet by mouth daily.    . Naproxen Sod-Diphenhydramine (ALEVE PM) 220-25 MG TABS Take 220-440 mg by mouth at bedtime. For sleep.    . Omega-3 Fatty Acids (FISH OIL) 1200 MG CAPS Take by mouth.    . oxyCODONE-acetaminophen (PERCOCET) 5-325 MG per tablet Take 1-2 tablets by mouth every 6 (six)  hours as needed. 20 tablet 0  . Polyvinyl Alcohol-Povidone (REFRESH OP) Place 1 drop into both eyes daily as needed (dry eyes).    Marland Kitchen sulfamethoxazole-trimethoprim (BACTRIM,SEPTRA) 400-80 MG per tablet Take 1 tablet by mouth daily.     . tamsulosin (FLOMAX) 0.4 MG CAPS capsule Take 0.4 mg by mouth daily.     . valsartan (DIOVAN) 320 MG tablet Take 320 mg by mouth daily.      No current facility-administered medications for this visit.   No Known Allergies   Review of Systems: Gen: Denies any fever, chills, sweats, anorexia, fatigue, weakness, malaise, weight loss, and sleep disorder CV: Denies chest pain, angina, palpitations, syncope, orthopnea, PND, peripheral edema, and claudication. Resp: Denies dyspnea at rest, dyspnea with exercise, cough, sputum, wheezing, coughing up blood, and pleurisy. GI: Denies vomiting blood, jaundice, and fecal incontinence.   Denies dysphagia or odynophagia. GU : Had had frequent UTIs and pneumaturia. MS: Denies joint pain, limitation of movement, and swelling, stiffness, low back pain, extremity pain. Denies muscle weakness, cramps, atrophy.  Derm: Denies rash, itching, dry skin, hives, moles, warts, or unhealing ulcers.  Psych: Denies depression, anxiety, memory loss, suicidal ideation, hallucinations, paranoia, and confusion. Heme: Denies bruising, bleeding, and enlarged lymph nodes. Neuro:  Denies any headaches, dizziness, paresthesias. Endo:  Denies any problems with DM, thyroid, adrenal function  Studies: Ct Abdomen Pelvis W Contrast  01/27/2014   CLINICAL DATA:  History of diverticulitis. Mid abdominal pain. Recent bladder scope due to air coming out during urination.  EXAM: CT ABDOMEN AND PELVIS WITH CONTRAST  TECHNIQUE: Multidetector CT imaging of the abdomen and pelvis was performed using the standard protocol following bolus administration of intravenous contrast.  CONTRAST:  50mL OMNIPAQUE IOHEXOL 300 MG/ML SOLN, 80mL OMNIPAQUE IOHEXOL 300 MG/ML  SOLN  COMPARISON:  12/27/2013  FINDINGS: Minimal dependent atelectasis in the lung bases. Heart is borderline in size. No effusions.  Gallstone noted within the gallbladder, stable. Small low-density lesion within the caudate lobe of the liver, stable, compatible with cyst. Spleen, pancreas, adrenals are unremarkable.  5 mm nonobstructing stone in the midpole of the right kidney, stable. Punctate calcification in the left hilum is felt to be vascular rather than within the urinary tract. No ureteral stones or hydronephrosis.  Gas is noted within the urinary bladder, similar to prior study. Inflammatory change noted around the adjacent sigmoid colon with diverticulosis. Findings suspicious for active diverticulitis and associated colovesical fistula. No focal fluid collection to suggest abscess.  Stomach and small bowel are decompressed, unremarkable. No free fluid or free air.  Aorta is calcified, non aneurysmal. Bilateral scratch has small bilateral inguinal hernias containing fat. No acute bony abnormality. Degenerative changes in the thoracolumbar spine.  IMPRESSION: Sigmoid diverticulosis with inflammatory changes around the sigmoid  colon compatible with active diverticulitis. There is into mid association of the sigmoid colon with the urinary bladder with gas in the bladder. Findings suggestive of colovesical fistula. This appearance is stable since prior study. The inflammation around the sigmoid colon has increased since prior study.  Stable right nephrolithiasis.   Electronically Signed   By: Charlett NoseKevin  Dover M.D.   On: 01/27/2014 12:15    LAB RESULTS: CBC on 01/25/2014 had a white blood cell count 7.1, hemoglobin 13.2, hematocrit 39.3, platelet count 179,000.  Prior Endoscopies:  Colonoscopy 08/03/09:   ENDOSCOPIC IMPRESSION:  1) Submucosal lesion in cecum near appendix with one cm firm  area on top - biopsiesd  2) Severe diverticulosis in the left colon  3) Internal hemorrhoids   4) Otherwise normal examination, excellent prep  RECOMMENDATIONS:  1) Await biopsy results  REPEAT EXAM: In for Colonoscopy, pending biopsy results. Physical Exam: Ht 5' 7.72" (1.72 m)  Wt 240 lb 2 oz (108.92 kg)  BMI 36.82 kg/m2 Constitutional: Pleasant,well-developed, male in no acute distress. HEENT: Normocephalic and atraumatic. Conjunctivae are normal. No scleral icterus. Neck supple. no thyromegaly Cardiovascular: Normal rate, regular rhythm.  Pulmonary/chest: Effort normal and breath sounds normal.  Abdominal: Soft, nondistended,mild RLQ and suprapubic tenderness, Bowel sounds active throughout. There are no masses palpable. No hepatomegaly. Extremities: no edema Lymphadenopathy: No cervical adenopathy noted. Neurological: Alert and oriented to person place and time. Skin: Skin is warm and dry. No rashes noted. Psychiatric: Normal mood and affect. Behavior is normal.  ASSESSMENT AND PLAN: [age] year [age] male status post repeated episodes of cystitis and pyuria amongst a history of diverticular disease recently found to have a colovesical fistula. The patient is to have surgery with Dr. Michaell CowingGross for repair of the fistula. The patient is to be scheduled for a colonoscopy prior to his surgery. He would like to have only one prep and so an attempt will be made to coordinate the colonoscopy with his surgery with the colonoscopy being done in the hospital the day prior to his surgery.The risks, benefits, and alternatives to colonoscopy with possible biopsy and possible polypectomy were discussed with the patient and they consent to proceed.     Treyvion Durkee, Tollie PizzaLori P PA-C 02/06/2014, 11:43 AM

## 2014-02-09 NOTE — Progress Notes (Signed)
Agree w/ Ms. Hvozdovic's note and mangement. I am waiting to hear back re: date of planned surgery to coordinate colonoscopy

## 2014-02-16 NOTE — Progress Notes (Signed)
PJ, have you heard anything further regarding the status of this? Lawson FiscalLori

## 2014-02-16 NOTE — Progress Notes (Signed)
I've called Dr. Gordy SaversGross's office and they are checking into this and will call back, thus far he does not have a surgery date.

## 2014-02-22 ENCOUNTER — Telehealth: Payer: Self-pay | Admitting: Internal Medicine

## 2014-02-22 NOTE — Telephone Encounter (Signed)
Left message for [facility] to call back. 

## 2014-02-27 ENCOUNTER — Other Ambulatory Visit: Payer: Self-pay | Admitting: Urology

## 2014-02-27 NOTE — Telephone Encounter (Signed)
I received a voicemail over the holiday to call back on Tuesday

## 2014-02-28 NOTE — Telephone Encounter (Signed)
Thanks to Dr. Arlan OrganKaplan Am ccing Dr. Michaell CowingGross

## 2014-02-28 NOTE — Telephone Encounter (Signed)
His surgery is scheduled for 03/23/14.  They want procedure done prior to.  You have an opening on 03/09/14?  Dr. Arlyce DiceKaplan is the hospital doc that week and has several outpatients already scheduled.

## 2014-02-28 NOTE — Telephone Encounter (Signed)
I told Dr. Michaell CowingGross if I was not available I would see if my partner could do the colonoscopy Am ccing Dr. Arlyce DiceKaplan to see if he can help. If not we will need to do it separate from the surgery

## 2014-02-28 NOTE — Telephone Encounter (Signed)
Okay to schedule colonoscopy in conjunction with surgery with Dr. Michaell CowingGross

## 2014-03-01 NOTE — Telephone Encounter (Signed)
Patient is scheduled [facility] at [facility] with Dr. Arlyce DiceKaplan for 03/22/14 10:30.  Patient notified and he is scheduled for a pre-visit for 03/07/14 1:00.

## 2014-03-01 NOTE — Telephone Encounter (Signed)
Case scheduled for 03/22/14 10:30 at [facility]

## 2014-03-02 NOTE — Telephone Encounter (Signed)
Left detailed message for the surgery coordinator with all the details of Salley ScarletGeorges upcoming procedure.

## 2014-03-03 ENCOUNTER — Other Ambulatory Visit: Payer: Self-pay

## 2014-03-03 DIAGNOSIS — Z1211 Encounter for screening for malignant neoplasm of colon: Secondary | ICD-10-CM

## 2014-03-07 ENCOUNTER — Ambulatory Visit (AMBULATORY_SURGERY_CENTER): Payer: Self-pay | Admitting: *Deleted

## 2014-03-07 ENCOUNTER — Other Ambulatory Visit: Payer: Self-pay | Admitting: *Deleted

## 2014-03-07 VITALS — Ht 70.0 in | Wt 237.4 lb

## 2014-03-07 DIAGNOSIS — Z1211 Encounter for screening for malignant neoplasm of colon: Secondary | ICD-10-CM

## 2014-03-07 MED ORDER — NA SULFATE-K SULFATE-MG SULF 17.5-3.13-1.6 GM/177ML PO SOLN: ORAL | Status: DC

## 2014-03-07 NOTE — Progress Notes (Signed)
No allergies to eggs or soy. No problems with anesthesia.  Pt given Emmi instructions for colonoscopy  No oxygen use  No diet drug use  

## 2014-03-08 NOTE — Progress Notes (Signed)
PJ, do you know the status of this?

## 2014-03-08 NOTE — Progress Notes (Signed)
Dr. Arlyce DiceKaplan is doing the colonoscopy for Dr. Leone PayorGessner on 03/22/14 and then on 03/23/14 Dr Michaell CowingGross is doing his surgery.

## 2014-03-13 ENCOUNTER — Other Ambulatory Visit: Payer: Self-pay | Admitting: Urology

## 2014-03-13 NOTE — Progress Notes (Signed)
Called Dr Ellin GoodieGrapey's office orders requested to be put in Epic sign and held surgery 03-23-14 pre op 03-17-14 Thanks

## 2014-03-16 NOTE — Patient Instructions (Addendum)
Scott Nixon  03/16/2014   Your procedure is scheduled on: Thursday 03/23/2014  [facility]  En[facility]ance and follow signs to               Short Stay Center at   0530 AM.  Call this number if you have problems the morning of surgery [phone]   Remember: REMEMBER TO FOLLOW CLEAR LIQUID DIET DAY BEFORE SURGERY !   CLEAR LIQUID DIET   Foods Allowed                                                                     Foods Excluded  Coffee and tea, regular and decaf                             liquids that you cannot  Plain Jell-O in any flavor                                             see through such as: Fruit ices (not with fruit pulp)                                     milk, soups, orange juice  Iced Popsicles                                    All solid food Carbonated beverages, regular and diet                                    Cranberry, grape and apple juices Sports drinks like Gatorade Lightly seasoned clear broth or consume(fat free) Sugar, honey syrup  Sample Menu Breakfast                                Lunch                                     Supper Cranberry juice                    Beef broth                            Chicken broth Jell-O                                     Grape juice                           Apple juice Coffee or tea  Jell-O                                      Popsicle                                                Coffee or tea                        Coffee or tea  _____________________________________________________________________    Do not eat food or drink liquids :After Midnight.     Take these medicines the morning of surgery with A SIP OF WATER: Atenolol, Metoprolol                               You may not have any metal on your body including hair pins and              piercings  Do not wear jewelry, make-up, lotions, powders or perfumes.             Do not wear nail  polish.  Do not shave  48 hours prior to surgery.              Men may shave face and neck.   Do not bring valuables to the hospital. [facility] IS NOT             RESPONSIBLE   FOR VALUABLES.  Contacts, dentures or bridgework may not be worn into surgery.  Leave suitcase in the car. After surgery it may be brought to your room.     Patients discharged the day of surgery will not be allowed to drive home.  Name and phone number of your driver:  Special Instructions: N/A              Please read over the following fact sheets you were given: _____________________________________________________________________             [facility] - Preparing for [facility] Before surgery, you can play an important role.  Because skin is not sterile, your skin needs to be as free of germs as possible.  You can reduce the number of germs on your skin by washing with CHG (chlorahexidine gluconate) soap before surgery.  CHG is an antiseptic cleaner which kills germs and bonds with the skin to continue killing germs even after washing. Please DO NOT use if you have an allergy to CHG or antibacterial soaps.  If your skin becomes reddened/irritated stop using the CHG and inform your nurse when you arrive at Short Stay. Do not shave (including legs and underarms) for at least 48 hours prior to the first CHG shower.  You may shave your face/neck. Please follow these instructions carefully:  1.  Shower with CHG Soap the night before surgery and the  morning of Surgery.  2.  If you choose to wash your hair, wash your hair first as usual with your  normal  shampoo.  3.  After you shampoo, rinse your hair and body thoroughly to remove the  shampoo.                           4.  Use CHG as you would any other liquid soap.  You can apply chg directly  to the skin and wash                       Gently with a scrungie or clean washcloth.  5.  Apply the CHG Soap to your body ONLY FROM THE NECK DOWN.   Do not use on face/  open                           Wound or open sores. Avoid contact with eyes, ears mouth and genitals (private parts).                       Wash face,  Genitals (private parts) with your normal soap.             6.  Wash thoroughly, paying special attention to the area where your surgery  will be performed.  7.  Thoroughly rinse your body with warm water from the neck down.  8.  DO NOT shower/wash with your normal soap after using and rinsing off  the CHG Soap.                9.  Pat yourself dry with a clean towel.            10.  Wear clean pajamas.            11.  Place clean sheets on your bed the night of your first shower and do not  sleep with pets. Day of Surgery : Do not apply any lotions/deodorants the morning of surgery.  Please wear clean clothes to the hospital/surgery center.  FAILURE TO FOLLOW THESE INSTRUCTIONS MAY RESULT IN THE CANCELLATION OF YOUR SURGERY PATIENT SIGNATURE_________________________________  NURSE SIGNATURE__________________________________  ________________________________________________________________________   Rogelia Mire  An incentive spirometer is a tool that can help keep your lungs clear and active. This tool measures how well you are filling your lungs with each breath. Taking long deep breaths may help reverse or decrease the chance of developing breathing (pulmonary) problems (especially infection) following:  A long period of time when you are unable to move or be active. BEFORE THE PROCEDURE   If the spirometer includes an indicator to show your best effort, your nurse or respiratory therapist will set it to a desired goal.  If possible, sit up straight or lean slightly forward. Try not to slouch.  Hold the incentive spirometer in an upright position. INSTRUCTIONS FOR USE   Sit on the edge of your bed if possible, or sit up as far as you can in bed or on a chair.  Hold the incentive spirometer in an upright  position.  Breathe out normally.  Place the mouthpiece in your mouth and seal your lips tightly around it.  Breathe in slowly and as deeply as possible, raising the piston or the ball toward the top of the column.  Hold your breath for 3-5 seconds or for as long as possible. Allow the piston or ball to fall to the bottom of the column.  Remove the mouthpiece from your mouth and breathe out normally.  Rest for a few seconds and repeat Steps 1 through 7 at least 10 times every 1-2 hours when you are awake. Take your time and take a few normal breaths between deep breaths.  The spirometer may include an indicator to show your  best effort. Use the indicator as a goal to work toward during each repetition.  After each set of 10 deep breaths, practice coughing to be sure your lungs are clear. If you have an incision (the cut made at the time of surgery), support your incision when coughing by placing a pillow or rolled up towels firmly against it. Once you are able to get out of bed, walk around indoors and cough well. You may stop using the incentive spirometer when instructed by your caregiver.  RISKS AND COMPLICATIONS  Take your time so you do not get dizzy or light-headed.  If you are in pain, you may need to take or ask for pain medication before doing incentive spirometry. It is harder to take a deep breath if you are having pain. AFTER USE  Rest and breathe slowly and easily.  It can be helpful to keep track of a log of your progress. Your caregiver can provide you with a simple table to help with this. If you are using the spirometer at home, follow these instructions: SEEK MEDICAL CARE IF:   You are having difficultly using the spirometer.  You have trouble using the spirometer as often as instructed.  Your pain medication is not giving enough relief while using the spirometer.  You develop fever of 100.5 F (38.1 C) or higher. SEEK IMMEDIATE MEDICAL CARE IF:   You cough  up bloody sputum that had not been present before.  You develop fever of 102 F (38.9 C) or greater.  You develop worsening pain at or near the incision site. MAKE SURE YOU:   Understand these instructions.  Will watch your condition.  Will get help right away if you are not doing well or get worse. Document Released: 07/28/2006 Document Revised: 06/09/2011 Document Reviewed: 09/28/2006 Exit[employer] Patient Information 2014 ExitCare, [place].   ________________________________________________________________________  WHAT IS A BLOOD TRANSFUSION? Blood Transfusion Information  A transfusion is the replacement of blood or some of its parts. Blood is made up of multiple cells which provide different functions.  Red blood cells carry oxygen and are used for blood loss replacement.  White blood cells fight against infection.  Platelets control bleeding.  Plasma helps clot blood.  Other blood products are available for specialized needs, such as hemophilia or other clotting disorders. BEFORE THE TRANSFUSION  Who gives blood for transfusions?   Healthy volunteers who are fully evaluated to make sure their blood is safe. This is blood bank blood. Transfusion therapy is the safest it has ever been in the practice of medicine. Before blood is taken from a donor, a complete history is taken to make sure that person has no history of diseases nor engages in risky social behavior (examples are intravenous drug use or sexual activity with multiple partners). The donor's travel history is screened to minimize risk of transmitting infections, such as malaria. The donated blood is tested for signs of infectious diseases, such as HIV and hepatitis. The blood is then tested to be sure it is compatible with you in order to minimize the chance of a transfusion reaction. If you or a relative donates blood, this is often done in anticipation of surgery and is not appropriate for emergency situations. It takes  many days to process the donated blood. RISKS AND COMPLICATIONS Although transfusion therapy is very safe and saves many lives, the main dangers of transfusion include:   Getting an infectious disease.  Developing a transfusion reaction. This is an allergic reaction to something  in the blood you were given. Every precaution is taken to prevent this. The decision to have a blood transfusion has been considered carefully by your caregiver before blood is given. Blood is not given unless the benefits outweigh the risks. AFTER THE TRANSFUSION  Right after receiving a blood transfusion, you will usually feel much better and more energetic. This is especially true if your red blood cells have gotten low (anemic). The transfusion raises the level of the red blood cells which carry oxygen, and this usually causes an energy increase.  The nurse administering the transfusion will monitor you carefully for complications. HOME CARE INSTRUCTIONS  No special instructions are needed after a transfusion. You may find your energy is better. Speak with your caregiver about any limitations on activity for underlying diseases you may have. SEEK MEDICAL CARE IF:   Your condition is not improving after your transfusion.  You develop redness or irritation at the intravenous (IV) site. SEEK IMMEDIATE MEDICAL CARE IF:  Any of the following symptoms occur over the next 12 hours:  Shaking chills.  You have a temperature by mouth above 102 F (38.9 C), not controlled by medicine.  Chest, back, or muscle pain.  People around you feel you are not acting correctly or are confused.  Shortness of breath or difficulty breathing.  Dizziness and fainting.  You get a rash or develop hives.  You have a decrease in urine output.  Your urine turns a dark color or changes to pink, red, or brown. Any of the following symptoms occur over the next 10 days:  You have a temperature by mouth above 102 F (38.9 C), not  controlled by medicine.  Shortness of breath.  Weakness after normal activity.  The white part of the eye turns yellow (jaundice).  You have a decrease in the amount of urine or are urinating less often.  Your urine turns a dark color or changes to pink, red, or brown. Document Released: 03/14/2000 Document Revised: 06/09/2011 Document Reviewed: 11/01/2007 [facility] Patient Information 2014 [place], [place].  _______________________________________________________________________

## 2014-03-16 NOTE — Progress Notes (Signed)
Called to request orders for surgery 03-23-14 pre op 03-17-14 be put in Epic Thanks

## 2014-03-17 ENCOUNTER — Encounter (HOSPITAL_COMMUNITY)
Admission: RE | Admit: 2014-03-17 | Discharge: 2014-03-17 | Disposition: A | Discharge status: Home / Self Care | Payer: Medicare Other | Source: Ambulatory Visit | Attending: Surgery | Admitting: Surgery

## 2014-03-17 ENCOUNTER — Encounter (HOSPITAL_COMMUNITY): Payer: Self-pay

## 2014-03-17 ENCOUNTER — Ambulatory Visit (HOSPITAL_COMMUNITY)
Admission: RE | Admit: 2014-03-17 | Discharge: 2014-03-17 | Disposition: A | Discharge status: Home / Self Care | Payer: Medicare Other | Source: Ambulatory Visit | Attending: Anesthesiology | Admitting: Anesthesiology

## 2014-03-17 DIAGNOSIS — Z01818 Encounter for other preprocedural examination: Secondary | ICD-10-CM | POA: Insufficient documentation

## 2014-03-17 DIAGNOSIS — I10 Essential (primary) hypertension: Secondary | ICD-10-CM

## 2014-03-17 DIAGNOSIS — I4510 Unspecified right bundle-branch block: Secondary | ICD-10-CM | POA: Insufficient documentation

## 2014-03-17 DIAGNOSIS — K5790 Diverticulosis of intestine, part unspecified, without perforation or abscess without bleeding: Secondary | ICD-10-CM | POA: Diagnosis not present

## 2014-03-17 DIAGNOSIS — M5134 Other intervertebral disc degeneration, thoracic region: Secondary | ICD-10-CM | POA: Insufficient documentation

## 2014-03-17 DIAGNOSIS — R001 Bradycardia, unspecified: Secondary | ICD-10-CM | POA: Diagnosis not present

## 2014-03-17 DIAGNOSIS — M2578 Osteophyte, vertebrae: Secondary | ICD-10-CM | POA: Insufficient documentation

## 2014-03-17 HISTORY — DX: Headache, unspecified: R51.9

## 2014-03-17 HISTORY — DX: Anemia, unspecified: D64.9

## 2014-03-17 HISTORY — DX: Headache: R51

## 2014-03-17 LAB — URINALYSIS, ROUTINE W REFLEX MICROSCOPIC
Bilirubin Urine: NEGATIVE
Glucose, UA: NEGATIVE mg/dL
Ketones, ur: NEGATIVE mg/dL
NITRITE: POSITIVE — AB
PROTEIN: 30 mg/dL — AB
SPECIFIC GRAVITY, URINE: 1.013 (ref 1.005–1.030)
UROBILINOGEN UA: 0.2 mg/dL (ref 0.0–1.0)
pH: 5.5 (ref 5.0–8.0)

## 2014-03-17 LAB — CBC
HCT: 40.8 % (ref 39.0–52.0)
Hemoglobin: 12.9 g/dL — ABNORMAL LOW (ref 13.0–17.0)
MCH: 28.9 pg (ref 26.0–34.0)
MCHC: 31.6 g/dL (ref 30.0–36.0)
MCV: 91.3 fL (ref 78.0–100.0)
Platelets: 187 10*3/uL (ref 150–400)
RBC: 4.47 MIL/uL (ref 4.22–5.81)
RDW: 14.3 % (ref 11.5–15.5)
WBC: 7.4 10*3/uL (ref 4.0–10.5)

## 2014-03-17 LAB — BASIC METABOLIC PANEL
Anion gap: 11 (ref 5–15)
BUN: 23 mg/dL (ref 6–23)
CHLORIDE: 105 meq/L (ref 96–112)
CO2: 24 mEq/L (ref 19–32)
CREATININE: 1.3 mg/dL (ref 0.50–1.35)
Calcium: 9.7 mg/dL (ref 8.4–10.5)
GFR calc non Af Amer: 53 mL/min — ABNORMAL LOW (ref >90)
GFR, EST AFRICAN AMERICAN: 61 mL/min — AB (ref >90)
Glucose, Bld: 121 mg/dL — ABNORMAL HIGH (ref 70–99)
POTASSIUM: 4.9 meq/L (ref 3.7–5.3)
Sodium: 140 mEq/L (ref 137–147)

## 2014-03-17 LAB — URINE MICROSCOPIC-ADD ON

## 2014-03-17 LAB — HEMOGLOBIN A1C
Hgb A1c MFr Bld: 5.8 % — ABNORMAL HIGH (ref <5.7)
MEAN PLASMA GLUCOSE: 120 mg/dL — AB (ref <117)

## 2014-03-17 LAB — ABO/RH: ABO/RH(D): O NEG

## 2014-03-17 NOTE — Progress Notes (Signed)
   03/17/14 1052  OBSTRUCTIVE SLEEP APNEA  Have you ever been diagnosed with sleep apnea through a sleep study? No  Do you snore loudly (loud enough to be heard through closed doors)?  0  Do you often feel tired, fatigued, or sleepy during the daytime? 1  Has anyone observed you stop breathing during your sleep? 1  Do you have, or are you being treated for high blood pressure? 1  BMI more than 35 kg/m2? 0  Age over 73 years old? 1  Neck circumference greater than 40 cm/16 inches? 1  Gender: 1  Obstructive Sleep Apnea Score 6  Score 4 or greater  Results sent to PCP

## 2014-03-17 NOTE — Progress Notes (Signed)
U/A and  micro results done 03/17/14  faxed via EPIC to Dr Michaell CowingGross

## 2014-03-17 NOTE — Consult Note (Addendum)
WOC ostomy consult note Stoma type/location:  Consult requested for pre-surgical stoma site marking; requested to mark for ileostomy and colostomy by CCS team.  Surgery planned for 12/24. Assessed abd while standing, sitting, and moving.  Difficult to find optimal site location: pt has large protruding abd, ribs appear lower in position than most, and folds are located to lower abd. Marks placed within rectus muscle, in line of vision, to area free from folds.   Mark placed to LLQ, 8 cm to the left of umbilicus and 4 cm below.   Mark placed to RLQ, 7 cm to the left of umbilicus and 3 cm below.   There are significant skin folds which appear when pt leans forward and are located above and below these marks which should be avoided if possible.   Marking pen given to patient and instructions given so he can reinforce markings if they begin to wear off before surgery next week.  Demonstrated pouch appearance and briefly discussed pouch change routines to pt and wife at bedside.  Educational materials given and they deny further questions at this time. WOC team will plan to follow post-op for educational sessions. Cammie Mcgeeawn Konor Noren MSN, RN, CWOCN, [place]AP, CNS [phone]

## 2014-03-17 NOTE — Progress Notes (Signed)
Late entry for 03-16-14 [facility] called at 1045 back Dr. Isabel CapriceGrapey only wants the order for obtain consent no labs

## 2014-03-17 NOTE — Progress Notes (Signed)
HGA1C done 03/17/14 faxed via EPIC to Dr Michaell CowingGross.

## 2014-03-21 MED ORDER — SODIUM CHLORIDE 0.9 % IV SOLN
INTRAVENOUS | Status: DC
Start: 2014-03-21 — End: 2014-03-22
  Filled 2014-03-21: qty 6

## 2014-03-22 ENCOUNTER — Encounter (HOSPITAL_COMMUNITY): Payer: Self-pay | Admitting: Gastroenterology

## 2014-03-22 ENCOUNTER — Ambulatory Visit (HOSPITAL_BASED_OUTPATIENT_CLINIC_OR_DEPARTMENT_OTHER)
Admission: RE | Admit: 2014-03-22 | Discharge: 2014-03-22 | Disposition: A | Discharge status: Home / Self Care | Payer: Medicare Other | Source: Ambulatory Visit | Attending: Gastroenterology | Admitting: Gastroenterology

## 2014-03-22 ENCOUNTER — Encounter (HOSPITAL_COMMUNITY)
Admission: RE | Disposition: A | Discharge status: Home / Self Care | Payer: Self-pay | Source: Ambulatory Visit | Attending: Gastroenterology

## 2014-03-22 DIAGNOSIS — I10 Essential (primary) hypertension: Secondary | ICD-10-CM | POA: Insufficient documentation

## 2014-03-22 DIAGNOSIS — Z7982 Long term (current) use of aspirin: Secondary | ICD-10-CM | POA: Insufficient documentation

## 2014-03-22 DIAGNOSIS — K5730 Diverticulosis of large intestine without perforation or abscess without bleeding: Secondary | ICD-10-CM | POA: Insufficient documentation

## 2014-03-22 DIAGNOSIS — M109 Gout, unspecified: Secondary | ICD-10-CM | POA: Insufficient documentation

## 2014-03-22 DIAGNOSIS — Z1211 Encounter for screening for malignant neoplasm of colon: Secondary | ICD-10-CM

## 2014-03-22 DIAGNOSIS — K219 Gastro-esophageal reflux disease without esophagitis: Secondary | ICD-10-CM

## 2014-03-22 DIAGNOSIS — E780 Pure hypercholesterolemia: Secondary | ICD-10-CM

## 2014-03-22 DIAGNOSIS — E119 Type 2 diabetes mellitus without complications: Secondary | ICD-10-CM | POA: Insufficient documentation

## 2014-03-22 DIAGNOSIS — K632 Fistula of intestine: Secondary | ICD-10-CM

## 2014-03-22 HISTORY — PX: COLONOSCOPY: SHX5424

## 2014-03-22 LAB — GLUCOSE, CAPILLARY: Glucose-Capillary: 109 mg/dL — ABNORMAL HIGH (ref 70–99)

## 2014-03-22 SURGERY — COLONOSCOPY
Anesthesia: Moderate Sedation

## 2014-03-22 MED ORDER — SODIUM CHLORIDE 0.9 % IV SOLN
INTRAVENOUS | Status: DC
  Administered 2014-03-22 (×2): 500 mL via INTRAVENOUS

## 2014-03-22 MED ORDER — SODIUM CHLORIDE 0.9 % IV SOLN: INTRAVENOUS | Status: DC

## 2014-03-22 MED ORDER — MIDAZOLAM HCL 10 MG/2ML IJ SOLN
INTRAMUSCULAR | Status: AC
  Filled 2014-03-22: qty 2

## 2014-03-22 MED ORDER — FENTANYL CITRATE 0.05 MG/ML IJ SOLN
INTRAMUSCULAR | Status: DC | PRN
  Administered 2014-03-22 (×3): 25 ug via INTRAVENOUS

## 2014-03-22 MED ORDER — FENTANYL CITRATE 0.05 MG/ML IJ SOLN
INTRAMUSCULAR | Status: AC
  Filled 2014-03-22: qty 2

## 2014-03-22 MED ORDER — MIDAZOLAM HCL 5 MG/5ML IJ SOLN
INTRAMUSCULAR | Status: DC | PRN
Start: 2014-03-22 — End: 2014-03-22
  Administered 2014-03-22: 1 mg via INTRAVENOUS
  Administered 2014-03-22 (×3): 2 mg via INTRAVENOUS

## 2014-03-22 NOTE — Op Note (Signed)
[facility] [address] [place], [id number]   COLONOSCOPY PROCEDURE REPORT     EXAM DATE: 03/22/2014  PATIENT NAME:      Scott Nixon, Lelynd H           MR #:      [id number]  BIRTHDATE:       Apr 26, 1940      VISIT #:     [id number]  ATTENDING:     Louis Meckelobert D Derius Ghosh, MD     STATUS:     outpatient REFERRING MD:      Karie SodaSteven Gross, M.D.  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 PROCEDURE PERFORMED:     Colonoscopy, diagnostic MEDICATIONS:     Versed 5 mg IV and Fentanyl 75 mcg IV ESTIMATED BLOOD LOSS:     None  CONSENT: The patient understands the risks and benefits of the procedure and understands that these risks include, but are not limited to: sedation, allergic reaction, infection, perforation and/or bleeding. Alternative means of evaluation and treatment include, among others: physical exam, x-rays, and/or surgical intervention. The patient elects to proceed with this endoscopic procedure.  DESCRIPTION OF PROCEDURE: During intra-op preparation period all mechanical & medical equipment was checked for proper function. Hand hygiene and appropriate measures for infection prevention was taken. After the risks, benefits and alternatives of the procedure were thoroughly explained, Informed consent was verified, confirmed and timeout was successfully executed by the treatment team. A digital exam revealed no abnormalities of the rectum.      The Pentax Ped Colon [id number] endoscope was introduced through the anus and advanced to the cecum, which was identified by both the appendix and ileocecal valve. No adverse events experienced. The prep was excellent using Suprep. The instrument was then slowly withdrawn as the colon was fully examined.   COLON FINDINGS: There was moderate diverticulosis noted in the sigmoid colon with associated petechiae and  muscular hypertrophy. The examination was otherwise normal. Retroflexed views revealed no abnormalities.  The scope was then completely withdrawn from the patient and the procedure terminated. WITHDRAWAL TIME: 9 minutes 0 seconds    ADVERSE EVENTS:      There were no immediate complications.  IMPRESSIONS:     1.  There was moderate diverticulosis noted in the sigmoid colon 2.  The examination was otherwise normal  RECOMMENDATIONS:     surgery for colovesicular fistula RECALL:  Louis Meckelobert D Deloyd Handy, MD eSigned:  Louis Meckelobert D Ajax Schroll, MD 03/22/2014 11:23 AM   cc:  CPT CODES: ICD CODES:  The ICD and CPT codes recommended by this software are interpretations from the data that the clinical staff has captured with the software.  The verification of the translation of this report to the ICD and CPT codes and modifiers is the sole responsibility of the health care institution and practicing physician where this report was generated.  [employer] will not be held responsible for the validity of the ICD and CPT codes included on this report.  AMA assumes no liability for data contained or not contained herein. CPT is a Publishing rights managerregistered trademark of the [employer].   PATIENT NAME:  Scott Nixon, Rishab H MR#: [id number]

## 2014-03-22 NOTE — Discharge Instructions (Signed)

## 2014-03-22 NOTE — H&P (Signed)
HPI:[name] is a 73 year old male referred by Dr. Merlene LaughterHal Stoneking for evaluation due to a recent finding of a colovesical fistula.  [name] is known to the practice has he had a colonoscopy in September 2004 by Dr. Rosiland OzSam Bauer which was normal. In 2011, he had abdominal pain and had a CT of the abdomen that revealed a right lower quadrant abscess with spontaneous perforation which was treated and healed with antibiotics and drain. He had a colonoscopy on 08/03/2009 that revealed a submucosal lesion in the cecum biopsies revealed benign colonic mucosa.  [name] states he was feeling well until this summer when he began to have repeated bouts of cystitis and pyuria he was evaluated at [facility] and had a CT that showed a nonobstructive 7 mm stone in the right kidney. There was evidence of diverticulosis, but no obvious intra-abdominal inflammation. His urine was markedly abnormal and culture was positive for Klebsiella. Put on a course of Septra and remains on suppressive therapy the patient had also experienced several bouts of pneumaturia. He had been noticing increased bubbles in his urine. He underwent a cystoscopy on October 28 and examination of the bladder demonstrated erythematous mucosa located near the dome of the bladder and edema located near the dome of the bladder. Scott Nixon may have a relatively small GI-GU fistula secondary to diverticular disease. He then had a CT of the abdomen and pelvis with contrast on 01/27/2014 that showed sigmoid diverticulosis with inflammatory changes around the sigmoid colon compatible with active diverticulitis. There is intermittent association of the sigmoid colon with the urinary bladder with gas in the bladder. Findings suggestive of colovesical fistula. Patient is to be scheduled for a surgical repair, but is here today because he needs to coordinate a colonoscopy prior to surgery. He states his bowel movements have been regular. He's had no  bright red blood per rectum and no melena.   Past Medical History  Diagnosis Date  . Diabetes mellitus without complication     metformin  . GERD (gastroesophageal reflux disease)   . Gout   . Hypercholesterolemia   . HTN (hypertension)   . Sleep apnea   . Diverticulosis   . Nephrolithiasis   . Chronic cystitis   . Abdominal hernia   . Colovesical fistula     Past Surgical History  Procedure Laterality Date  . Right cornea transplant    . Hernia repair    . Eye surgery    . Left shoulder surgery     Family History  Problem Relation Age of Onset  . Heart failure Father   . Emphysema Mother    History  Substance Use Topics  . Smoking status: Never Smoker   . Smokeless tobacco: Never Used  . Alcohol Use: No   Current Outpatient Prescriptions  Medication Sig Dispense Refill  . aspirin EC 81 MG tablet Take 81 mg by mouth daily.     Marland Kitchen. atenolol (TENORMIN) 25 MG tablet Take 25 mg by mouth daily.     Marland Kitchen. esomeprazole (NEXIUM) 40 MG packet Take 40 mg by mouth as needed.    . ezetimibe (ZETIA) 10 MG tablet Take 10 mg by mouth daily.     Marland Kitchen. ibuprofen (ADVIL,MOTRIN) 200 MG tablet Take 400 mg by mouth every 6 (six) hours as needed for mild pain or moderate pain (gout in big toe).    . metFORMIN (GLUCOPHAGE) 500 MG tablet Take 1,000 mg by mouth daily with breakfast.     .  metoprolol (LOPRESSOR) 50 MG tablet Take 50 mg by mouth daily.    . metroNIDAZOLE (FLAGYL) 500 MG tablet Take 1 tablet (500 mg total) by mouth 3 (three) times daily. One po bid x 7 days 21 tablet 0  . Multiple Vitamin (MULTIVITAMIN) tablet Take 1 tablet by mouth daily.    . Naproxen Sod-Diphenhydramine (ALEVE PM) 220-25 MG TABS Take 220-440 mg by mouth at bedtime. For sleep.    . Omega-3 Fatty Acids (FISH OIL) 1200 MG CAPS Take by mouth.    . oxyCODONE-acetaminophen  (PERCOCET) 5-325 MG per tablet Take 1-2 tablets by mouth every 6 (six) hours as needed. 20 tablet 0  . Polyvinyl Alcohol-Povidone (REFRESH OP) Place 1 drop into both eyes daily as needed (dry eyes).    Marland Kitchen. sulfamethoxazole-trimethoprim (BACTRIM,SEPTRA) 400-80 MG per tablet Take 1 tablet by mouth daily.     . tamsulosin (FLOMAX) 0.4 MG CAPS capsule Take 0.4 mg by mouth daily.     . valsartan (DIOVAN) 320 MG tablet Take 320 mg by mouth daily.      No current facility-administered medications for this visit.   No Known Allergies   Review of Systems: Gen: Denies any fever, chills, sweats, anorexia, fatigue, weakness, malaise, weight loss, and sleep disorder CV: Denies chest pain, angina, palpitations, syncope, orthopnea, PND, peripheral edema, and claudication. Resp: Denies dyspnea at rest, dyspnea with exercise, cough, sputum, wheezing, coughing up blood, and pleurisy. GI: Denies vomiting blood, jaundice, and fecal incontinence. Denies dysphagia or odynophagia. GU : Had had frequent UTIs and pneumaturia. MS: Denies joint pain, limitation of movement, and swelling, stiffness, low back pain, extremity pain. Denies muscle weakness, cramps, atrophy.  Derm: Denies rash, itching, dry skin, hives, moles, warts, or unhealing ulcers.  Psych: Denies depression, anxiety, memory loss, suicidal ideation, hallucinations, paranoia, and confusion. Heme: Denies bruising, bleeding, and enlarged lymph nodes. Neuro: Denies any headaches, dizziness, paresthesias. Endo: Denies any problems with DM, thyroid, adrenal function  Studies:  Imaging Results    Ct Abdomen Pelvis W Contrast  01/27/2014 CLINICAL DATA: History of diverticulitis. Mid abdominal pain. Recent bladder scope due to air coming out during urination. EXAM: CT ABDOMEN AND PELVIS WITH CONTRAST TECHNIQUE: Multidetector CT imaging of the abdomen and pelvis was performed using the standard protocol following bolus  administration of intravenous contrast. CONTRAST: 50mL OMNIPAQUE IOHEXOL 300 MG/ML SOLN, 80mL OMNIPAQUE IOHEXOL 300 MG/ML SOLN COMPARISON: 12/27/2013 FINDINGS: Minimal dependent atelectasis in the lung bases. Heart is borderline in size. No effusions. Gallstone noted within the gallbladder, stable. Small low-density lesion within the caudate lobe of the liver, stable, compatible with cyst. Spleen, pancreas, adrenals are unremarkable. 5 mm nonobstructing stone in the midpole of the right kidney, stable. Punctate calcification in the left hilum is felt to be vascular rather than within the urinary tract. No ureteral stones or hydronephrosis. Gas is noted within the urinary bladder, similar to prior study. Inflammatory change noted around the adjacent sigmoid colon with diverticulosis. Findings suspicious for active diverticulitis and associated colovesical fistula. No focal fluid collection to suggest abscess. Stomach and small bowel are decompressed, unremarkable. No free fluid or free air. Aorta is calcified, non aneurysmal. Bilateral scratch has small bilateral inguinal hernias containing fat. No acute bony abnormality. Degenerative changes in the thoracolumbar spine. IMPRESSION: Sigmoid diverticulosis with inflammatory changes around the sigmoid colon compatible with active diverticulitis. There is into mid association of the sigmoid colon with the urinary bladder with gas in the bladder. Findings suggestive of colovesical fistula. This appearance is stable  since prior study. The inflammation around the sigmoid colon has increased since prior study. Stable right nephrolithiasis. Electronically Signed By: Charlett Nose M.D. On: 01/27/2014 12:15     LAB RESULTS: CBC on 01/25/2014 had a white blood cell count 7.1, hemoglobin 13.2, hematocrit 39.3, platelet count 179,000.  Prior Endoscopies: Colonoscopy 08/03/09:   ENDOSCOPIC IMPRESSION:  1) Submucosal lesion in cecum near appendix  with one cm firm  area on top - biopsiesd  2) Severe diverticulosis in the left colon  3) Internal hemorrhoids  4) Otherwise normal examination, excellent prep  RECOMMENDATIONS:  1) Await biopsy results  REPEAT EXAM: In for Colonoscopy, pending biopsy results. Physical Exam: Ht 5' 7.72" (1.72 m)  Wt 240 lb 2 oz (108.92 kg)  BMI 36.82 kg/m2 Constitutional: Pleasant,well-developed, male in no acute distress. HEENT: Normocephalic and atraumatic. Conjunctivae are normal. No scleral icterus. Neck supple. no thyromegaly Cardiovascular: Normal rate, regular rhythm.  Pulmonary/chest: Effort normal and breath sounds normal.  Abdominal: Soft, nondistended,mild RLQ and suprapubic tenderness, Bowel sounds active throughout. There are no masses palpable. No hepatomegaly. Extremities: no edema Lymphadenopathy: No cervical adenopathy noted. Neurological: Alert and oriented to person place and time. Skin: Skin is warm and dry. No rashes noted. Psychiatric: Normal mood and affect. Behavior is normal.  ASSESSMENT AND PLAN: [age] year old male status post repeated episodes of cystitis and pyuria amongst a history of diverticular disease recently found to have a colovesical fistula. The patient is to have surgery with Dr. Michaell Cowing for repair of the fistula. The patient is to be scheduled for a colonoscopy prior to his surgery. He would like to have only one prep and so an attempt will be made to coordinate the colonoscopy with his surgery with the colonoscopy being done in the hospital the day prior to his surgery.The risks, benefits, and alternatives to colonoscopy with possible biopsy and possible polypectomy were discussed with the patient and they consent to proceed.

## 2014-03-23 ENCOUNTER — Inpatient Hospital Stay (HOSPITAL_COMMUNITY)
Admission: RE | Admit: 2014-03-23 | Discharge: 2014-03-26 | DRG: 654 | Disposition: A | Discharge status: Home / Self Care | Payer: Medicare Other | Source: Ambulatory Visit | Attending: Surgery | Admitting: Surgery

## 2014-03-23 ENCOUNTER — Inpatient Hospital Stay (HOSPITAL_COMMUNITY): Payer: Medicare Other | Admitting: Certified Registered Nurse Anesthetist

## 2014-03-23 ENCOUNTER — Inpatient Hospital Stay (HOSPITAL_COMMUNITY): Payer: Medicare Other

## 2014-03-23 ENCOUNTER — Encounter (HOSPITAL_COMMUNITY): Payer: Self-pay | Admitting: *Deleted

## 2014-03-23 ENCOUNTER — Encounter (HOSPITAL_COMMUNITY)
Admission: RE | Disposition: A | Discharge status: Home / Self Care | Payer: Self-pay | Source: Ambulatory Visit | Attending: Surgery

## 2014-03-23 DIAGNOSIS — Z87440 Personal history of urinary (tract) infections: Secondary | ICD-10-CM

## 2014-03-23 DIAGNOSIS — Z825 Family history of asthma and other chronic lower respiratory diseases: Secondary | ICD-10-CM

## 2014-03-23 DIAGNOSIS — K5732 Diverticulitis of large intestine without perforation or abscess without bleeding: Secondary | ICD-10-CM | POA: Diagnosis present

## 2014-03-23 DIAGNOSIS — G4730 Sleep apnea, unspecified: Secondary | ICD-10-CM | POA: Diagnosis present

## 2014-03-23 DIAGNOSIS — Z87442 Personal history of urinary calculi: Secondary | ICD-10-CM

## 2014-03-23 DIAGNOSIS — E119 Type 2 diabetes mellitus without complications: Secondary | ICD-10-CM | POA: Diagnosis present

## 2014-03-23 DIAGNOSIS — E780 Pure hypercholesterolemia: Secondary | ICD-10-CM | POA: Diagnosis present

## 2014-03-23 DIAGNOSIS — N138 Other obstructive and reflux uropathy: Secondary | ICD-10-CM | POA: Diagnosis present

## 2014-03-23 DIAGNOSIS — I10 Essential (primary) hypertension: Secondary | ICD-10-CM | POA: Diagnosis present

## 2014-03-23 DIAGNOSIS — Z8249 Family history of ischemic heart disease and other diseases of the circulatory system: Secondary | ICD-10-CM

## 2014-03-23 DIAGNOSIS — M109 Gout, unspecified: Secondary | ICD-10-CM | POA: Diagnosis present

## 2014-03-23 DIAGNOSIS — K388 Other specified diseases of appendix: Secondary | ICD-10-CM | POA: Diagnosis present

## 2014-03-23 DIAGNOSIS — G250 Essential tremor: Secondary | ICD-10-CM | POA: Diagnosis present

## 2014-03-23 DIAGNOSIS — H02109 Unspecified ectropion of unspecified eye, unspecified eyelid: Secondary | ICD-10-CM | POA: Insufficient documentation

## 2014-03-23 DIAGNOSIS — N321 Vesicointestinal fistula: Secondary | ICD-10-CM | POA: Diagnosis present

## 2014-03-23 DIAGNOSIS — K219 Gastro-esophageal reflux disease without esophagitis: Secondary | ICD-10-CM | POA: Diagnosis present

## 2014-03-23 DIAGNOSIS — L290 Pruritus ani: Secondary | ICD-10-CM | POA: Diagnosis present

## 2014-03-23 DIAGNOSIS — N3021 Other chronic cystitis with hematuria: Secondary | ICD-10-CM | POA: Diagnosis present

## 2014-03-23 HISTORY — PX: PROCTOSCOPY: SHX2266

## 2014-03-23 HISTORY — PX: LAPAROSCOPIC PARTIAL COLECTOMY: SHX5907

## 2014-03-23 HISTORY — PX: APPENDECTOMY: SHX54

## 2014-03-23 HISTORY — PX: CYSTOSCOPY WITH STENT PLACEMENT: SHX5790

## 2014-03-23 LAB — GLUCOSE, CAPILLARY
GLUCOSE-CAPILLARY: 109 mg/dL — AB (ref 70–99)
GLUCOSE-CAPILLARY: 221 mg/dL — AB (ref 70–99)
GLUCOSE-CAPILLARY: 134 mg/dL — AB (ref 70–99)
Glucose-Capillary: 204 mg/dL — ABNORMAL HIGH (ref 70–99)

## 2014-03-23 LAB — TYPE AND SCREEN
ABO/RH(D): O NEG
ANTIBODY SCREEN: NEGATIVE

## 2014-03-23 SURGERY — APPENDECTOMY
Anesthesia: General | Site: Ureter

## 2014-03-23 MED ORDER — PHENOL 1.4 % MT LIQD
2.0000 | OROMUCOSAL | Status: DC | PRN
  Administered 2014-03-23: 2 via OROMUCOSAL
  Filled 2014-03-23: qty 177

## 2014-03-23 MED ORDER — PHENYLEPHRINE HCL 10 MG/ML IJ SOLN
INTRAMUSCULAR | Status: DC | PRN
  Administered 2014-03-23: 40 ug via INTRAVENOUS
  Administered 2014-03-23: 20 ug via INTRAVENOUS
  Administered 2014-03-23: 40 ug via INTRAVENOUS
  Administered 2014-03-23: 20 ug via INTRAVENOUS

## 2014-03-23 MED ORDER — TAMSULOSIN HCL 0.4 MG PO CAPS
0.4000 mg | ORAL_CAPSULE | Freq: Every day | ORAL | Status: DC
  Administered 2014-03-23 – 2014-03-26 (×4): 0.4 mg via ORAL
  Filled 2014-03-23 (×4): qty 1

## 2014-03-23 MED ORDER — ONDANSETRON HCL 4 MG PO TABS
4.0000 mg | ORAL_TABLET | Freq: Four times a day (QID) | ORAL | Status: DC | PRN
  Administered 2014-03-24: 4 mg via ORAL
  Filled 2014-03-23: qty 1

## 2014-03-23 MED ORDER — METFORMIN HCL 500 MG PO TABS
500.0000 mg | ORAL_TABLET | Freq: Every day | ORAL | Status: DC
  Administered 2014-03-24 – 2014-03-26 (×3): 500 mg via ORAL
  Filled 2014-03-23 (×5): qty 1

## 2014-03-23 MED ORDER — MEPERIDINE HCL 50 MG/ML IJ SOLN: 6.2500 mg | INTRAMUSCULAR | Status: DC | PRN

## 2014-03-23 MED ORDER — SUCCINYLCHOLINE CHLORIDE 20 MG/ML IJ SOLN
INTRAMUSCULAR | Status: DC | PRN
  Administered 2014-03-23: 120 mg via INTRAVENOUS

## 2014-03-23 MED ORDER — ATROPINE SULFATE 0.4 MG/ML IJ SOLN
INTRAMUSCULAR | Status: AC
  Filled 2014-03-23: qty 1

## 2014-03-23 MED ORDER — ONDANSETRON HCL 4 MG/2ML IJ SOLN
INTRAMUSCULAR | Status: DC | PRN
  Administered 2014-03-23 (×2): 2 mg via INTRAVENOUS

## 2014-03-23 MED ORDER — 0.9 % SODIUM CHLORIDE (POUR BTL) OPTIME
TOPICAL | Status: DC | PRN
  Administered 2014-03-23: 2000 mL

## 2014-03-23 MED ORDER — LACTATED RINGERS IV BOLUS (SEPSIS): 1000.0000 mL | Freq: Once | INTRAVENOUS | Status: DC

## 2014-03-23 MED ORDER — GLYCOPYRROLATE 0.2 MG/ML IJ SOLN
INTRAMUSCULAR | Status: AC
  Filled 2014-03-23: qty 2

## 2014-03-23 MED ORDER — FENTANYL CITRATE 0.05 MG/ML IJ SOLN
25.0000 ug | INTRAMUSCULAR | Status: AC | PRN
Start: 2014-03-23 — End: 2014-03-23
  Administered 2014-03-23 (×6): 25 ug via INTRAVENOUS

## 2014-03-23 MED ORDER — ALUM & MAG HYDROXIDE-SIMETH 200-200-20 MG/5ML PO SUSP: 30.0000 mL | Freq: Four times a day (QID) | ORAL | Status: DC | PRN

## 2014-03-23 MED ORDER — CISATRACURIUM BESYLATE 20 MG/10ML IV SOLN
INTRAVENOUS | Status: AC
  Filled 2014-03-23: qty 10

## 2014-03-23 MED ORDER — LACTATED RINGERS IV BOLUS (SEPSIS): 1000.0000 mL | Freq: Three times a day (TID) | INTRAVENOUS | Status: AC | PRN

## 2014-03-23 MED ORDER — CYANOCOBALAMIN 500 MCG PO TABS
500.0000 ug | ORAL_TABLET | Freq: Every day | ORAL | Status: DC
Start: 2014-03-23 — End: 2014-03-26
  Administered 2014-03-23 – 2014-03-26 (×4): 500 ug via ORAL
  Filled 2014-03-23 (×5): qty 1

## 2014-03-23 MED ORDER — IOHEXOL 300 MG/ML  SOLN
INTRAMUSCULAR | Status: DC | PRN
  Administered 2014-03-23: 15 mL via URETHRAL

## 2014-03-23 MED ORDER — INSULIN ASPART 100 UNIT/ML ~~LOC~~ SOLN
0.0000 [IU] | Freq: Three times a day (TID) | SUBCUTANEOUS | Status: DC
  Administered 2014-03-23: 5 [IU] via SUBCUTANEOUS
  Administered 2014-03-24 (×2): 2 [IU] via SUBCUTANEOUS

## 2014-03-23 MED ORDER — IRBESARTAN 75 MG PO TABS
75.0000 mg | ORAL_TABLET | Freq: Every day | ORAL | Status: DC
  Administered 2014-03-23 – 2014-03-26 (×4): 75 mg via ORAL
  Filled 2014-03-23 (×4): qty 1

## 2014-03-23 MED ORDER — DIPHENHYDRAMINE HCL 12.5 MG/5ML PO ELIX
12.5000 mg | ORAL_SOLUTION | Freq: Four times a day (QID) | ORAL | Status: DC | PRN
  Administered 2014-03-24: 12.5 mg via ORAL
  Filled 2014-03-23: qty 5

## 2014-03-23 MED ORDER — FENTANYL CITRATE 0.05 MG/ML IJ SOLN
INTRAMUSCULAR | Status: DC | PRN
  Administered 2014-03-23 (×6): 50 ug via INTRAVENOUS

## 2014-03-23 MED ORDER — BUPIVACAINE 0.25 % ON-Q PUMP DUAL CATH 300 ML
300.0000 mL | INJECTION | Status: DC
  Filled 2014-03-23: qty 300

## 2014-03-23 MED ORDER — MENTHOL 3 MG MT LOZG: 1.0000 | LOZENGE | OROMUCOSAL | Status: DC | PRN

## 2014-03-23 MED ORDER — MIDAZOLAM HCL 5 MG/5ML IJ SOLN
INTRAMUSCULAR | Status: DC | PRN
  Administered 2014-03-23: 1 mg via INTRAVENOUS
  Administered 2014-03-23 (×2): .5 mg via INTRAVENOUS

## 2014-03-23 MED ORDER — FENTANYL CITRATE 0.05 MG/ML IJ SOLN
INTRAMUSCULAR | Status: AC
  Filled 2014-03-23: qty 2

## 2014-03-23 MED ORDER — INSULIN ASPART 100 UNIT/ML ~~LOC~~ SOLN: 0.0000 [IU] | Freq: Every day | SUBCUTANEOUS | Status: DC

## 2014-03-23 MED ORDER — SODIUM CHLORIDE 0.9 % IV SOLN
INTRAVENOUS | Status: DC
  Administered 2014-03-23: 17:00:00 via INTRAVENOUS
  Administered 2014-03-24 (×2): 75 mL/h via INTRAVENOUS

## 2014-03-23 MED ORDER — DEXTROSE 5 % IV SOLN
2.0000 g | INTRAVENOUS | Status: AC
  Administered 2014-03-23: 2 g via INTRAVENOUS
  Filled 2014-03-23: qty 2

## 2014-03-23 MED ORDER — EPHEDRINE SULFATE 50 MG/ML IJ SOLN
INTRAMUSCULAR | Status: DC | PRN
  Administered 2014-03-23 (×4): 5 mg via INTRAVENOUS

## 2014-03-23 MED ORDER — PROPOFOL 10 MG/ML IV BOLUS
INTRAVENOUS | Status: DC | PRN
  Administered 2014-03-23: 150 mg via INTRAVENOUS

## 2014-03-23 MED ORDER — ONDANSETRON HCL 4 MG/2ML IJ SOLN: 4.0000 mg | Freq: Four times a day (QID) | INTRAMUSCULAR | Status: DC | PRN

## 2014-03-23 MED ORDER — SODIUM CHLORIDE 0.9 % IV SOLN
INTRAVENOUS | Status: AC
  Filled 2014-03-23: qty 6

## 2014-03-23 MED ORDER — MIDAZOLAM HCL 2 MG/2ML IJ SOLN
INTRAMUSCULAR | Status: AC
  Filled 2014-03-23: qty 2

## 2014-03-23 MED ORDER — STERILE WATER FOR IRRIGATION IR SOLN
Status: DC | PRN
  Administered 2014-03-23: 2000 mL via INTRAVESICAL

## 2014-03-23 MED ORDER — BUPIVACAINE-EPINEPHRINE 0.25% -1:200000 IJ SOLN
INTRAMUSCULAR | Status: DC | PRN
  Administered 2014-03-23: 100 mL

## 2014-03-23 MED ORDER — ATENOLOL 25 MG PO TABS
25.0000 mg | ORAL_TABLET | Freq: Every day | ORAL | Status: DC
  Administered 2014-03-24 – 2014-03-26 (×3): 25 mg via ORAL
  Filled 2014-03-23 (×3): qty 1

## 2014-03-23 MED ORDER — LIDOCAINE HCL (CARDIAC) 20 MG/ML IV SOLN
INTRAVENOUS | Status: DC | PRN
Start: 2014-03-23 — End: 2014-03-23
  Administered 2014-03-23: 75 mg via INTRAVENOUS

## 2014-03-23 MED ORDER — BUPIVACAINE-EPINEPHRINE 0.25% -1:200000 IJ SOLN
INTRAMUSCULAR | Status: AC
  Filled 2014-03-23: qty 2

## 2014-03-23 MED ORDER — OXYCODONE HCL 5 MG PO TABS: 5.0000 mg | ORAL_TABLET | ORAL | Status: DC | PRN

## 2014-03-23 MED ORDER — NEOSTIGMINE METHYLSULFATE 10 MG/10ML IV SOLN
INTRAVENOUS | Status: DC | PRN
  Administered 2014-03-23: 4 mg via INTRAVENOUS

## 2014-03-23 MED ORDER — LACTATED RINGERS IV SOLN: INTRAVENOUS | Status: DC

## 2014-03-23 MED ORDER — VITAMIN C 500 MG PO TABS
500.0000 mg | ORAL_TABLET | Freq: Every day | ORAL | Status: DC
  Administered 2014-03-23 – 2014-03-26 (×4): 500 mg via ORAL
  Filled 2014-03-23 (×4): qty 1

## 2014-03-23 MED ORDER — ACETAMINOPHEN 500 MG PO TABS
1000.0000 mg | ORAL_TABLET | Freq: Three times a day (TID) | ORAL | Status: DC
  Administered 2014-03-23 – 2014-03-26 (×9): 1000 mg via ORAL
  Filled 2014-03-23 (×11): qty 2

## 2014-03-23 MED ORDER — ASPIRIN EC 81 MG PO TBEC
81.0000 mg | DELAYED_RELEASE_TABLET | Freq: Every day | ORAL | Status: DC
  Administered 2014-03-23 – 2014-03-26 (×4): 81 mg via ORAL
  Filled 2014-03-23 (×4): qty 1

## 2014-03-23 MED ORDER — ESOMEPRAZOLE MAGNESIUM 40 MG PO PACK: 40.0000 mg | PACK | Freq: Every day | ORAL | Status: DC | PRN

## 2014-03-23 MED ORDER — METOPROLOL TARTRATE 12.5 MG HALF TABLET
12.5000 mg | ORAL_TABLET | Freq: Two times a day (BID) | ORAL | Status: DC | PRN
  Filled 2014-03-23: qty 1

## 2014-03-23 MED ORDER — HYDROMORPHONE HCL 1 MG/ML IJ SOLN
0.5000 mg | INTRAMUSCULAR | Status: DC | PRN
  Administered 2014-03-23: 1 mg via INTRAVENOUS
  Administered 2014-03-24: 2 mg via INTRAVENOUS
  Administered 2014-03-24 – 2014-03-26 (×14): 1 mg via INTRAVENOUS
  Filled 2014-03-23: qty 2
  Filled 2014-03-23 (×3): qty 1
  Filled 2014-03-23: qty 2
  Filled 2014-03-23 (×11): qty 1

## 2014-03-23 MED ORDER — FENTANYL CITRATE 0.05 MG/ML IJ SOLN
INTRAMUSCULAR | Status: AC
  Filled 2014-03-23: qty 5

## 2014-03-23 MED ORDER — HEPARIN SODIUM (PORCINE) 5000 UNIT/ML IJ SOLN
5000.0000 [IU] | Freq: Once | INTRAMUSCULAR | Status: AC
  Administered 2014-03-23: 5000 [IU] via SUBCUTANEOUS
  Filled 2014-03-23: qty 1

## 2014-03-23 MED ORDER — GLYCOPYRROLATE 0.2 MG/ML IJ SOLN
INTRAMUSCULAR | Status: AC
  Filled 2014-03-23: qty 3

## 2014-03-23 MED ORDER — CHLORHEXIDINE GLUCONATE 4 % EX LIQD: 60.0000 mL | Freq: Once | CUTANEOUS | Status: DC

## 2014-03-23 MED ORDER — LACTATED RINGERS IV SOLN
INTRAVENOUS | Status: DC
  Administered 2014-03-23: 1 via INTRAVENOUS

## 2014-03-23 MED ORDER — RINGERS IRRIGATION IR SOLN
Status: DC | PRN
  Administered 2014-03-23: 1000 mL

## 2014-03-23 MED ORDER — ALVIMOPAN 12 MG PO CAPS
12.0000 mg | ORAL_CAPSULE | Freq: Two times a day (BID) | ORAL | Status: DC
  Administered 2014-03-24 – 2014-03-26 (×5): 12 mg via ORAL
  Filled 2014-03-23 (×6): qty 1

## 2014-03-23 MED ORDER — LACTATED RINGERS IV SOLN
INTRAVENOUS | Status: DC | PRN
  Administered 2014-03-23 (×4): via INTRAVENOUS

## 2014-03-23 MED ORDER — SACCHAROMYCES BOULARDII 250 MG PO CAPS
250.0000 mg | ORAL_CAPSULE | Freq: Two times a day (BID) | ORAL | Status: DC
  Administered 2014-03-23 – 2014-03-26 (×6): 250 mg via ORAL
  Filled 2014-03-23 (×8): qty 1

## 2014-03-23 MED ORDER — EPHEDRINE SULFATE 50 MG/ML IJ SOLN
INTRAMUSCULAR | Status: AC
  Filled 2014-03-23: qty 1

## 2014-03-23 MED ORDER — GLYCOPYRROLATE 0.2 MG/ML IJ SOLN
INTRAMUSCULAR | Status: DC | PRN
  Administered 2014-03-23 (×3): .1 mg via INTRAVENOUS
  Administered 2014-03-23: .8 mg via INTRAVENOUS
  Administered 2014-03-23: .1 mg via INTRAVENOUS

## 2014-03-23 MED ORDER — LIDOCAINE HCL (CARDIAC) 20 MG/ML IV SOLN
INTRAVENOUS | Status: AC
  Filled 2014-03-23: qty 5

## 2014-03-23 MED ORDER — ESOMEPRAZOLE MAGNESIUM 40 MG PO CPDR
40.0000 mg | DELAYED_RELEASE_CAPSULE | Freq: Every day | ORAL | Status: DC | PRN
  Filled 2014-03-23: qty 1

## 2014-03-23 MED ORDER — MAGIC MOUTHWASH
15.0000 mL | Freq: Four times a day (QID) | ORAL | Status: DC | PRN
  Filled 2014-03-23: qty 15

## 2014-03-23 MED ORDER — DIPHENHYDRAMINE HCL 50 MG/ML IJ SOLN: 12.5000 mg | Freq: Four times a day (QID) | INTRAMUSCULAR | Status: DC | PRN

## 2014-03-23 MED ORDER — METOPROLOL TARTRATE 1 MG/ML IV SOLN
5.0000 mg | Freq: Four times a day (QID) | INTRAVENOUS | Status: DC | PRN
  Filled 2014-03-23: qty 5

## 2014-03-23 MED ORDER — ALVIMOPAN 12 MG PO CAPS
12.0000 mg | ORAL_CAPSULE | Freq: Once | ORAL | Status: AC
  Administered 2014-03-23: 12 mg via ORAL
  Filled 2014-03-23: qty 1

## 2014-03-23 MED ORDER — ACETAMINOPHEN 10 MG/ML IV SOLN
1000.0000 mg | Freq: Once | INTRAVENOUS | Status: AC
  Administered 2014-03-23: 1000 mg via INTRAVENOUS
  Filled 2014-03-23: qty 100

## 2014-03-23 MED ORDER — ZOLPIDEM TARTRATE 5 MG PO TABS
5.0000 mg | ORAL_TABLET | Freq: Every evening | ORAL | Status: DC | PRN
  Administered 2014-03-24: 5 mg via ORAL
  Filled 2014-03-23: qty 1

## 2014-03-23 MED ORDER — SODIUM CHLORIDE 0.9 % IJ SOLN
INTRAMUSCULAR | Status: AC
  Filled 2014-03-23: qty 10

## 2014-03-23 MED ORDER — CISATRACURIUM BESYLATE (PF) 10 MG/5ML IV SOLN
INTRAVENOUS | Status: DC | PRN
  Administered 2014-03-23: 2 mg via INTRAVENOUS
  Administered 2014-03-23 (×2): 4 mg via INTRAVENOUS
  Administered 2014-03-23: 6 mg via INTRAVENOUS
  Administered 2014-03-23: 10 mg via INTRAVENOUS
  Administered 2014-03-23: 2 mg via INTRAVENOUS

## 2014-03-23 MED ORDER — PROMETHAZINE HCL 25 MG/ML IJ SOLN: 6.2500 mg | INTRAMUSCULAR | Status: DC | PRN

## 2014-03-23 MED ORDER — DEXAMETHASONE SODIUM PHOSPHATE 10 MG/ML IJ SOLN
INTRAMUSCULAR | Status: AC
  Filled 2014-03-23: qty 1

## 2014-03-23 MED ORDER — HEPARIN SODIUM (PORCINE) 5000 UNIT/ML IJ SOLN
5000.0000 [IU] | Freq: Three times a day (TID) | INTRAMUSCULAR | Status: DC
  Administered 2014-03-23 – 2014-03-26 (×8): 5000 [IU] via SUBCUTANEOUS
  Filled 2014-03-23 (×11): qty 1

## 2014-03-23 MED ORDER — DEXTROSE 5 % IV SOLN
2.0000 g | Freq: Two times a day (BID) | INTRAVENOUS | Status: AC
  Administered 2014-03-23: 2 g via INTRAVENOUS
  Filled 2014-03-23 (×2): qty 2

## 2014-03-23 MED ORDER — NEOSTIGMINE METHYLSULFATE 10 MG/10ML IV SOLN
INTRAVENOUS | Status: AC
  Filled 2014-03-23: qty 1

## 2014-03-23 MED ORDER — LIP MEDEX EX OINT
1.0000 "application " | TOPICAL_OINTMENT | Freq: Two times a day (BID) | CUTANEOUS | Status: DC
  Administered 2014-03-23 – 2014-03-26 (×5): 1 via TOPICAL

## 2014-03-23 MED ORDER — ONDANSETRON HCL 4 MG/2ML IJ SOLN
INTRAMUSCULAR | Status: AC
  Filled 2014-03-23: qty 2

## 2014-03-23 MED ORDER — PROPOFOL 10 MG/ML IV BOLUS
INTRAVENOUS | Status: AC
  Filled 2014-03-23: qty 20

## 2014-03-23 SURGICAL SUPPLY — 74 items
APPLIER CLIP 5 13 M/L LIGAMAX5 (MISCELLANEOUS)
APPLIER CLIP ROT 10 11.4 M/L (STAPLE)
CABLE HIGH FREQUENCY MONO STRZ (ELECTRODE) ×4 IMPLANT
CATH KIT ON-Q SILVERSOAK 7.5IN (CATHETERS) ×8 IMPLANT
CELLS DAT CNTRL 66122 CELL SVR (MISCELLANEOUS) IMPLANT
CHLORAPREP W/TINT 26ML (MISCELLANEOUS) ×4 IMPLANT
CLIP APPLIE 5 13 M/L LIGAMAX5 (MISCELLANEOUS) IMPLANT
CLIP APPLIE ROT 10 11.4 M/L (STAPLE) IMPLANT
COUNTER NEEDLE 20 DBL MAG RED (NEEDLE) ×4 IMPLANT
DECANTER SPIKE VIAL GLASS SM (MISCELLANEOUS) ×4 IMPLANT
DRAIN CHANNEL 19F RND (DRAIN) IMPLANT
DRAPE CAMERA CLOSED 9X96 (DRAPES) ×4 IMPLANT
DRAPE LAPAROSCOPIC ABDOMINAL (DRAPES) ×4 IMPLANT
DRAPE SURG IRRIG POUCH 19X23 (DRAPES) ×4 IMPLANT
DRAPE UTILITY XL STRL (DRAPES) ×8 IMPLANT
DRSG OPSITE POSTOP 4X10 (GAUZE/BANDAGES/DRESSINGS) IMPLANT
DRSG OPSITE POSTOP 4X6 (GAUZE/BANDAGES/DRESSINGS) ×4 IMPLANT
DRSG OPSITE POSTOP 4X8 (GAUZE/BANDAGES/DRESSINGS) IMPLANT
DRSG TEGADERM 2-3/8X2-3/4 SM (GAUZE/BANDAGES/DRESSINGS) ×8 IMPLANT
DRSG TEGADERM 4X4.75 (GAUZE/BANDAGES/DRESSINGS) ×4 IMPLANT
ELECT PENCIL ROCKER SW 15FT (MISCELLANEOUS) ×8 IMPLANT
ELECT REM PT RETURN 15FT ADLT (MISCELLANEOUS) ×4 IMPLANT
ENDOLOOP SUT PDS II  0 18 (SUTURE)
ENDOLOOP SUT PDS II 0 18 (SUTURE) IMPLANT
EVACUATOR SILICONE 100CC (DRAIN) IMPLANT
GAUZE SPONGE 2X2 8PLY STRL LF (GAUZE/BANDAGES/DRESSINGS) ×2 IMPLANT
GAUZE SPONGE 4X4 12PLY STRL (GAUZE/BANDAGES/DRESSINGS) ×4 IMPLANT
GLOVE ECLIPSE 8.0 STRL XLNG CF (GLOVE) ×8 IMPLANT
GLOVE INDICATOR 8.0 STRL GRN (GLOVE) ×8 IMPLANT
GOWN STRL REUS W/TWL XL LVL3 (GOWN DISPOSABLE) ×16 IMPLANT
GUIDEWIRE ANG ZIPWIRE 038X150 (WIRE) ×4 IMPLANT
LEGGING LITHOTOMY PAIR STRL (DRAPES) ×4 IMPLANT
LUBRICANT JELLY K Y 4OZ (MISCELLANEOUS) IMPLANT
NEEDLE HYPO 22GX1.5 SAFETY (NEEDLE) ×4 IMPLANT
PACK COLON (CUSTOM PROCEDURE TRAY) ×4 IMPLANT
PACK GENERAL/GYN (CUSTOM PROCEDURE TRAY) ×4 IMPLANT
PEN SKIN MARKING BROAD (MISCELLANEOUS) ×4 IMPLANT
PORT LAP GEL ALEXIS MED 5-9CM (MISCELLANEOUS) IMPLANT
RTRCTR WOUND ALEXIS 18CM MED (MISCELLANEOUS)
SCISSORS LAP 5X35 DISP (ENDOMECHANICALS) ×4 IMPLANT
SEALER TISSUE G2 STRG ARTC 35C (ENDOMECHANICALS) IMPLANT
SET IRRIG TUBING LAPAROSCOPIC (IRRIGATION / IRRIGATOR) ×4 IMPLANT
SLEEVE XCEL OPT CAN 5 100 (ENDOMECHANICALS) ×12 IMPLANT
SPONGE GAUZE 2X2 STER 10/PKG (GAUZE/BANDAGES/DRESSINGS) ×2
SPONGE LAP 18X18 X RAY DECT (DISPOSABLE) ×4 IMPLANT
STAPLER CUT CVD 40MM BLUE (STAPLE) ×4 IMPLANT
STAPLER CUT RELOAD BLUE (STAPLE) ×4 IMPLANT
STAPLER VISISTAT 35W (STAPLE) ×4 IMPLANT
SUCTION POOLE TIP (SUCTIONS) ×4 IMPLANT
SUT MNCRL AB 4-0 PS2 18 (SUTURE) ×4 IMPLANT
SUT PDS AB 1 CTX 36 (SUTURE) ×12 IMPLANT
SUT PDS AB 1 TP1 96 (SUTURE) IMPLANT
SUT PROLENE 0 CT 2 (SUTURE) IMPLANT
SUT PROLENE 2 0 SH DA (SUTURE) IMPLANT
SUT SILK 2 0 (SUTURE) ×2
SUT SILK 2 0 SH CR/8 (SUTURE) ×4 IMPLANT
SUT SILK 2-0 18XBRD TIE 12 (SUTURE) ×2 IMPLANT
SUT SILK 3 0 (SUTURE) ×2
SUT SILK 3 0 SH CR/8 (SUTURE) ×4 IMPLANT
SUT SILK 3-0 18XBRD TIE 12 (SUTURE) ×2 IMPLANT
SUT VIC AB 2-0 SH 18 (SUTURE) ×4 IMPLANT
SUT VICRYL 0 UR6 27IN ABS (SUTURE) IMPLANT
SYS LAPSCP GELPORT 120MM (MISCELLANEOUS)
SYSTEM LAPSCP GELPORT 120MM (MISCELLANEOUS) IMPLANT
TAPE UMBILICAL COTTON 1/8X30 (MISCELLANEOUS) ×4 IMPLANT
TOWEL OR 17X26 10 PK STRL BLUE (TOWEL DISPOSABLE) ×8 IMPLANT
TOWEL OR NON WOVEN STRL DISP B (DISPOSABLE) ×8 IMPLANT
TRAY FOLEY CATH 14FRSI W/METER (CATHETERS) ×4 IMPLANT
TROCAR BLADELESS OPT 5 100 (ENDOMECHANICALS) ×4 IMPLANT
TROCAR XCEL NON-BLD 11X100MML (ENDOMECHANICALS) ×4 IMPLANT
TUBING CONNECTING 10 (TUBING) IMPLANT
TUBING CONNECTING 10' (TUBING)
TUBING FILTER THERMOFLATOR (ELECTROSURGICAL) ×4 IMPLANT
TUNNELER SHEATH ON-Q 16GX12 DP (PAIN MANAGEMENT) ×4 IMPLANT

## 2014-03-23 NOTE — Transfer of Care (Signed)
Immediate Anesthesia Transfer of Care Note  Patient: Scott Nixon  Procedure(s) Performed: Procedure(s): APPENDECTOMY (N/A) RIGID PROCTOSCOPY (N/A) LAPAROSCOPIC LEFT  COLECTOMY, SPLENIC FLEXURE MOBILIZATION,  APPENDECTOMY, REPAIR BLADDER, COLOVESICAL FISTULA (N/A) CYSTOSCOPY WITH BILATERAL URETERAL STENT PLACEMENT (Bilateral)  Patient Location: PACU  Anesthesia Type:General  Level of Consciousness: awake, oriented, patient cooperative, lethargic and responds to stimulation  Airway & Oxygen Therapy: Patient Spontanous Breathing and Patient connected to face mask oxygen  Post-op Assessment: Report given to PACU RN and Post -op Vital signs reviewed and stable  Post vital signs: stable  Complications: No apparent anesthesia complications

## 2014-03-23 NOTE — Progress Notes (Signed)
Patient states he is not on the Metoprolol that it was d/c by Dr. Pete GlatterStoneking

## 2014-03-23 NOTE — Anesthesia Postprocedure Evaluation (Signed)
  Anesthesia Post-op Note  Patient: Scott Nixon  Procedure(s) Performed: Procedure(s) (LRB): APPENDECTOMY (N/A) RIGID PROCTOSCOPY (N/A) LAPAROSCOPIC LEFT  COLECTOMY, SPLENIC FLEXURE MOBILIZATION,  APPENDECTOMY, REPAIR BLADDER, COLOVESICAL FISTULA (N/A) CYSTOSCOPY WITH BILATERAL URETERAL STENT PLACEMENT (Bilateral)  Patient Location: PACU  Anesthesia Type: General  Level of Consciousness: awake and alert   Airway and Oxygen Therapy: Patient Spontanous Breathing  Post-op Pain: mild  Post-op Assessment: Post-op Vital signs reviewed, Patient's Cardiovascular Status Stable, Respiratory Function Stable, Patent Airway and No signs of Nausea or vomiting  Last Vitals:  Filed Vitals:   03/23/14 1732  BP: 156/78  Pulse: 72  Temp: 36.5 C  Resp: 16    Post-op Vital Signs: stable   Complications: No apparent anesthesia complications

## 2014-03-23 NOTE — Anesthesia Preprocedure Evaluation (Addendum)
Anesthesia Evaluation  Patient identified by MRN, date of birth, ID band Patient awake    Reviewed: Allergy & Precautions, H&P , NPO status , Patient's Chart, lab work & pertinent test results  Airway Mallampati: II  TM Distance: >3 FB Neck ROM: Full    Dental no notable dental hx. (+) Edentulous Upper, Edentulous Lower   Pulmonary sleep apnea ,  breath sounds clear to auscultation  Pulmonary exam normal       Cardiovascular hypertension, Pt. on medications Rhythm:Regular Rate:Normal     Neuro/Psych negative neurological ROS  negative psych ROS   GI/Hepatic negative GI ROS, Neg liver ROS,   Endo/Other  negative endocrine ROSdiabetes, Type 2, Oral Hypoglycemic Agents  Renal/GU negative Renal ROS  negative genitourinary   Musculoskeletal negative musculoskeletal ROS (+)   Abdominal   Peds negative pediatric ROS (+)  Hematology negative hematology ROS (+)   Anesthesia Other Findings   Reproductive/Obstetrics negative OB ROS                           Anesthesia Physical Anesthesia Plan  ASA: III  Anesthesia Plan: General   Post-op Pain Management:    Induction: Intravenous  Airway Management Planned: Oral ETT  Additional Equipment:   Intra-op Plan:   Post-operative Plan: Extubation in OR  Informed Consent: I have reviewed the patients History and Physical, chart, labs and discussed the procedure including the risks, benefits and alternatives for the proposed anesthesia with the patient or authorized representative who has indicated his/her understanding and acceptance.   Dental advisory given  Plan Discussed with: CRNA  Anesthesia Plan Comments:         Anesthesia Quick Evaluation

## 2014-03-23 NOTE — Anesthesia Procedure Notes (Signed)
Procedure Name: Intubation Date/Time: 03/23/2014 7:46 AM Performed by: Edison PaceGRAY, Scott Nixon-anesthesia Checklist: Patient identified, Timeout performed, Emergency Drugs available, Suction available and Patient being monitored Patient Re-evaluated:Patient Re-evaluated prior to inductionOxygen Delivery Method: Circle system utilized Preoxygenation: Nixon-oxygenation with 100% oxygen Intubation Type: IV induction and Cricoid Pressure applied Ventilation: Mask ventilation without difficulty Laryngoscope Size: Mac and 4 Grade View: Grade II Tube type: Oral Tube size: 7.5 mm Number of attempts: 1 Airway Equipment and Method: Stylet Placement Confirmation: ETT inserted through vocal cords under direct vision,  positive ETCO2 and breath sounds checked- equal and bilateral Secured at: 19 cm Tube secured with: Tape Dental Injury: Teeth and Oropharynx as per Nixon-operative assessment

## 2014-03-23 NOTE — H&P (Signed)
[facility]  [address] [place]  [place], [place] [address] Phone: [phone] FAX: [phone]     Scott Nixon  09-11-1940 [id number]  CARE TEAM:  PCP: Ginette Otto, MD  Outpatient Care Team: Patient Care Team: Merlene Laughter, MD as PCP - General (Internal Medicine) Karie Soda, MD as Consulting Physician (General Surgery) Iva Boop, MD as Consulting Physician (Gastroenterology) Valetta Fuller, MD as Consulting Physician (Urology)  Inpatient Treatment Team: Treatment Team: Attending Provider: Karie Soda, MD  This patient is a 73 y.o.male who presents today   Reason for evaluation: Colovesical fistula  The patient is a 73 year old male who presents with anal itching. Pleasant patient with history of RLQ abdominal pain intermittent last 10 years. h/o kidney stoins. Known diverticulosis. Had severe attack with abscess in 2011. Colonoscopy confirmed diverticulosis. Saw Dr. Jamey Ripa with our group 4 years ago. He has since retired. No surgery done. Sounds like the abscess was between the right colon and sigmoid colon. Thought to perhaps be appendiceal versus diverticular. Resolved after percutaneous drainage of abscess. Denies any problems in the past 4 years until the patient began to develop recurrent urinary tract infections. Persisted despite several rounds of oral antibiotics. Then began to have pneumaturia. CT scan showing inflammation of sigmoid colon on bladder with gas in bladder. Cystoscopy concerning for colovesical fistula. Surgical consultation requested. Patient had colonoscopy 2 times. Diverticulosis noted. Some bulging in cecum 4 years ago. Biopsies not consistent with polyp or cancer. Patient with new episode of pneumaturia & concern for colovesical fistula, Dr. Isabel Caprice with [facility] requested surgical evaluation.    Patient notes no more UTI or pneumaturia for the last 2 weeks.  No abd  pain this week - feeling better.  Past Medical History  Diagnosis Date  . Diabetes mellitus without complication     metformin  . GERD (gastroesophageal reflux disease)   . Gout   . Hypercholesterolemia   . HTN (hypertension)   . Sleep apnea   . Diverticulosis   . Nephrolithiasis   . Chronic cystitis   . Abdominal hernia   . Colovesical fistula   . Allergy   . Cataract   . Headache   . Anemia     Past Surgical History  Procedure Laterality Date  . Right cornea transplant  1964, 1998  . Hernia repair Right 1950  . Eye surgery Right 1963    eyelid  . Rotator cuff repair Left 2005  . Arm surgery Left 1988    torn ligament    History   Social History  . Marital Status: Significant Other    Spouse Name: N/A    Number of Children: 1  . Years of Education: N/A   Occupational History  . Owner     [employer]   Social History Main Topics  . Smoking status: Never Smoker   . Smokeless tobacco: Never Used  . Alcohol Use: No  . Drug Use: No  . Sexual Activity: Not Currently   Other Topics Concern  . Not on file   Social History Narrative    Family History  Problem Relation Age of Onset  . Heart failure Father   . Emphysema Mother   . Colon cancer Neg Hx     Current Facility-Administered Medications  Medication Dose Route Frequency Provider Last Rate Last Dose  . bupivacaine 0.25 % ON-Q pump DUAL CATH 300 mL  300 mL Other Continuous Karie Soda,  MD      . cefoTEtan (CEFOTAN) 2 g in dextrose 5 % 50 mL IVPB  2 g Intravenous On Call to OR Karie SodaSteven Shamiyah Ngu, MD      . chlorhexidine (HIBICLENS) 4 % liquid 4 application  60 mL Topical Once Karie SodaSteven Elleni Mozingo, MD       And  . Melene Muller[START ON 03/24/2014] chlorhexidine (HIBICLENS) 4 % liquid 4 application  60 mL Topical Once Karie SodaSteven Orchid Glassberg, MD       Facility-Administered Medications Ordered in Other Encounters  Medication Dose Route Frequency Provider Last Rate Last Dose  . lactated ringers infusion    Continuous PRN Vista LawmanBeth E  Eargle, CRNA         No Known Allergies  ROS: Constitutional:  No fevers, chills, sweats.  Weight stable Eyes:  No vision changes, No discharge HENT:  No sore throats, nasal drainage Lymph: No neck swelling, No bruising easily Pulmonary:  No cough, productive sputum CV: No orthopnea, PND.  No exertional chest/neck/shoulder/arm pain. GI:  No personal nor family history of GI/colon cancer, inflammatory bowel disease, irritable bowel syndrome, allergy such as Celiac Sprue, dietary/dairy problems, colitis, ulcers nor gastritis.  No recent sick contacts/gastroenteritis.  No travel outside the country.  No changes in diet. Renal: No UTIs, No hematuria Genital:  No drainage, bleeding, masses Musculoskeletal: No severe joint pain.  Good ROM major joints Skin:  No sores or lesions.  No rashes Heme/Lymph:  No easy bleeding.  No swollen lymph nodes Neuro: No focal weakness/numbness.  No seizures Psych: No suicidal ideation.  No hallucinations  BP 103/59 mmHg  Pulse 77  Temp(Src) 98.4 F (36.9 C) (Oral)  Resp 18  Ht 5\' 10"  (1.778 m)  Wt 227 lb (102.967 kg)  BMI 32.57 kg/m2  SpO2 97%  Physical Exam: General: Pt awake/alert/oriented x4 in no major acute distress Eyes: PERRL, normal EOM. Sclera nonicteric Neuro: CN II-XII intact w/o focal sensory/motor deficits. Lymph: No head/neck/groin lymphadenopathy Psych:  No delerium/psychosis/paranoia HENT: Normocephalic, Mucus membranes moist.  No thrush Neck: Supple, No tracheal deviation Chest: No pain.  Good respiratory excursion. CV:  Pulses intact.  Regular rhythm Abdomen: Soft, Nondistended.   Nontender.  No incarcerated hernias. Ext:  SCDs BLE.  No significant edema.  No cyanosis Skin: No petechiae / purpurea.  No major sores Musculoskeletal: No severe joint pain.  Good ROM major joints   Results:   Labs: Results for orders placed or performed during the hospital encounter of 03/23/14 (from the past 48 hour(s))  Glucose, capillary      Status: Abnormal   Collection Time: 03/23/14  5:14 AM  Result Value Ref Range   Glucose-Capillary 109 (H) 70 - 99 mg/dL    Imaging / Studies: Dg Chest 2 View  03/17/2014   CLINICAL DATA:  Preoperative exam prior to diverticulosis surgery  EXAM: CHEST  2 VIEW  COMPARISON:  None.  FINDINGS: The lungs are adequately inflated. There is no focal infiltrate. There are coarse infrahilar lung markings on the right. The heart and pulmonary vascularity appear normal. There is no pleural effusion. The trachea is midline. There mild degenerative disc disease of the thoracic spine with anterior and right lateral osteophytes.  IMPRESSION: There is no active cardiopulmonary disease.   Electronically Signed   By: David  SwazilandJordan   On: 03/17/2014 11:54    Medications / Allergies: per chart  Antibiotics: Anti-infectives    Start     Dose/Rate Route Frequency Ordered Stop   03/23/14 0507  cefoTEtan (CEFOTAN)  2 g in dextrose 5 % 50 mL IVPB     2 g100 mL/hr over 30 Minutes Intravenous On call to O.R. 03/23/14 0507 03/24/14 0559      Assessment  Scott Nixon  73 y.o. male  Day of Surgery  Procedure(s): APPENDECTOMY RIGID PROCTOSCOPY LAPAROSCOPIC PARTIAL COLECTOMY REPAIR COLOVESICAL FISTULA CYSTOSCOPY WITH BILATERAL URETERAL STENT PLACEMENT  Problem List:  Active Problems:   * No active hospital problems. 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   Plan:  Lap colectomy/appy with colovesical fistula repair:  The anatomy & physiology of the digestive tract was discussed.  The pathophysiology of the colon was discussed.  Natural history risks without surgery was discussed.   I feel the risks of no intervention will lead to serious problems that outweigh the operative risks; therefore, I recommended a partial colectomy to remove the pathology.  Minimally invasive (Robotic/Laparoscopic) & open techniques were discussed.   Risks such as bleeding, infection, abscess, leak, reoperation,  possible ostomy, bladder repair, ureter injury/reconstruction, hernia, heart attack, stroke, death, and other risks were discussed.  I noted a good likelihood this will help address the problem.   Goals of post-operative recovery were discussed as well.   Need for adequate nutrition, daily bowel regimen and healthy physical activity, to optimize recovery was noted as well. We will work to minimize complications.  Educational materials were available as well.  Questions were answered.  The patient expresses understanding & wishes to proceed with surgery.  -Urology to place ureteral stents.  Most likely Dr Isabel CapriceGrapey. -VTE prophylaxis- SCDs, etc -mobilize as tolerated to help recovery    Ardeth SportsmanSteven C. Avyaan Summer, M.D., F.A.C.S. Gastrointestinal and Minimally Invasive Surgery [facility] [address] [place], [place] [address] [phone] Main / Paging   03/23/2014  Note: Portions of this report may have been transcribed using voice recognition software. Every effort was made to ensure accuracy; however, inadvertent computerized transcription errors may be present.   Any transcriptional errors that result from this process are unintentional.

## 2014-03-23 NOTE — Transfer of Care (Signed)
Immediate Anesthesia Transfer of Care Note  Patient: Anner CreteGeorge H Kuang  Procedure(s) Performed: Procedure(s): APPENDECTOMY (N/A) RIGID PROCTOSCOPY (N/A) LAPAROSCOPIC LEFT  COLECTOMY, SPLENIC FLEXURE MOBILIZATION,  APPENDECTOMY, REPAIR BLADDER, COLOVESICAL FISTULA (N/A) CYSTOSCOPY WITH BILATERAL URETERAL STENT PLACEMENT (Bilateral)  Patient Location: PACU  Anesthesia Type:General  Level of Consciousness: awake, oriented, patient cooperative, lethargic and responds to stimulation  Airway & Oxygen Therapy: Patient Spontanous Breathing and Patient connected to face mask oxygen  Post-op Assessment: Report given to PACU RN, Post -op Vital signs reviewed and stable and Patient moving all extremities  Post vital signs: Reviewed and stable  Complications: No apparent anesthesia complications

## 2014-03-23 NOTE — Op Note (Signed)
03/23/2014  12:44 PM  PATIENT:  Scott Nixon  73 y.o. 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  Open END PROXIMAL.  Distal anastomotic ring.  Appendix  DISPOSITION OF SPECIMEN:  PATHOLOGY  COUNTS:  YES  PLAN OF CARE: Admit to inpatient   PATIENT DISPOSITION:  PACU - hemodynamically stable.  INDICATION:    Patient with recurrent urinary tract infections and hematuria suspicious for colovesical fistula.  Some appendiceal inflammation suspicious for secondary versus primary appendicitis.  I recommended segmental resection:  The anatomy & physiology of the digestive tract was discussed.  The pathophysiology was discussed.  Natural history risks without surgery was discussed.   I worked to give an overview of the disease and the frequent need to have multispecialty involvement.  I feel the risks of no intervention will lead to serious problems that outweigh the operative risks; therefore, I recommended a partial colectomy to remove the pathology.  Also removal of appendix.   Probable repair of fistula to bladder if persistent.  Laparoscopic & open techniques were discussed.  Recommendation made for ureteral stent placement to minimize injury and better identify anatomy to be placed by urology.  Risks such as bleeding, infection, abscess, leak, reoperation, possible ostomy, hernia, heart attack, death, and other risks were discussed.  I noted a good likelihood this will help address the problem.   Goals of post-operative recovery were discussed as well.  We will work to minimize complications.  Educational materials on the pathology had been given in the office.  Questions were answered.    The patient expressed understanding & wished to proceed with surgery.  OR FINDINGS:   Patient had redundant sigmoid colon with inflammatory adhesions to the infraumbilical midline in the dome of the bladder.  Ureters rather lateral.  Patient had very dilated distal half of the appendix with adhesions to the sigmoid colon and dome of bladder.  No obvious metastatic disease on visceral parietal peritoneum or liver.  The anastomosis rests 18 cm from the anal verge by rigid proctoscopy.  DESCRIPTION:   Informed consent was confirmed.  The patient underwent general anaesthesia without difficulty.  The patient was positioned appropriately.  VTE prevention in place.  The patient's abdomen was clipped, prepped, & draped in a sterile fashion.  Surgical timeout confirmed our plan.  The patient was positioned in reverse Trendelenburg.  Abdominal entry was gained using optical entry technique in the  right upper abdomen.  Entry was clean.  I induced carbon dioxide insufflation.  Camera inspection revealed no injury.  Extra ports were carefully placed under direct laparoscopic visualization.  Patient had dense adhesions of the redundant sigmoid colon to the lower midline down to  the bladder.  Carefully freed these off taking a section of the peritoneum.  There is also defect in the right  lower quadrant peritoneum was some fat within it.  No colon.  I kept the adhesions to the dome of the bladder in place for now.    I reflected the greater omentum and the upper abdomen the small bowel in the upper abdomen. I scored the base of peritoneum of the right side of the mesentery of the left colon from the ligament of Treitz to the peritoneal reflection of the mid rectum.  And at mobilizing the colon in a lateral to medial fashion.  Mobilized the sigmoid colon off the pelvic brim and continued up to the splenic flexure.  Help freed off retropertoineal attachments as he had a thinned out very redundant mesentery.  I elevated the sigmoid mesentery and entered into the retro-mesenteric plane to connect with my lateral mobilization. We were able to identify the left ureter and gonadal vessels. We kept those posterior within the retroperitoneum and elevated the left colon mesentery off that. I did isolated IMA pedicle but did not ligate it yet.  I continued distally and got into the avascular plane posterior to the mesorectum. This allowed me to help mobilize the rectum as well by freeing the mesorectum off the sacrum.  I mobilized the peritoneal coverings towards the peritoneal reflection on both the right and left sides of the rectum.  I could see the right and left ureters and stayed away from them.  I kept the lateral vascular pedicles to the rectum intact.  I skeletonized the lymph nodes off the inferior mesenteric artery pedicle.  I went down to its takeoff from the aorta.  I isolated the inferior mesenteric vein off of the ligament of Treitz just cephalad to that as well.  After confirming the left ureter was out of the way, I went ahead and ligated the inferior mesenteric artery pedicle with bipolar EnSeal just near its takeoff from the aorta.  I did ligate the inferior mesenteric vein in a similar fashion.  We ensured hemostasis. I skeletonized the mesorectum at the junction at the proximal rectum  using blunt dissection & bipolar EnSeal.  I mobilized the left colon in a lateral to medial fashion off the line of Toldt up towards the splenic flexure to ensure good mobilization of the left colon to reach into the pelvis.  His descending colon was somewhat shortn and still would not reach well, I ended up mobilizing the splenic flexure and distal transverse colon off the retroperitoneum.  I came and freed the attachments off the left kidney and spleen.  Then the inferior pancreatic rim.  Freed greater omentum off the transverse colon from the mid transverse colon more distally.  This allowed much better mobilization of the left colon to reach down the pelvis for a good splenic flexure mobilization.  I freed the rectosigmoid colon off the anterior suprapubic peritoneum and dome of the bladder.  I did this with sharp scissors to remove a 3 cm button of adhesions to the preperitoneum and dome of the bladder.  Did not see any breech into the mucosa.  With this I could better mobilize the proximal rectum.  I mobilized it posteriorly.  Then chose an area in the proximal rectum and transected through the mesorectum and skeletonized that well.  I chose an area in the proximal mid descending colon that reached down the pelvis.  I transected the the left colon mesentery radially  to this point.  I identified the adhesions to the cecum and appendix.  Transected the small colon off these areas.  I transected the mesial appendix down to the base of the appendix.  I placed a clamp on the appendix for further extracorporeal transection  I placed a wound protector through a Pfannenstiel incision in the suprapubic region, taking care to avoid bladder injury. I transected bowel at the proximal rectum using a contour stapler. I was able to eviscerate the rectosigmoid and descending colon out the wound.  I chose a region at the descending/sigmoid junction that was soft and easily reached down. I clamped the colon proximal to this  area using a soft bowel clamp. I transected at the descending/sigmoid junction with a scalpel. I got healthy bleeding mucosa. I transected the remaining specimen mesentery in a radial fashion to preserve good blood supply to the proximal colon end.  We sent the rectosigmoid colon specimen off to go to pathology.  I transected the appendix and a cuff of cecum using a another load of the contour stapler to divide healthy noninflamed cecum remaining.  Distal half of the appendix was rather enlarged with an obvious transition point.  The proximal half the appendix was very narrow  We sized the colon orifice.  I chose a  33 EEA anvil stapler system. I placed the anvil to the open end of the descending colon and closed around it using a 0 Prolene pursestring.  We did copious irrigation with crystalloid solution.  Hemostasis was good.  The distal end of the colon at the handle easily reached down to the rectal stump.  Dr Ezzard StandingNewman scrubbed down and did gentle anal dilation and advanced the EEA stapler up the rectal stump. The spike was brought out at the provimal end of the rectal stump under direct visualization.  I attached the anvil of the proximal colon the spike of the stapler. Anvil was tightened down and held clamped for 60 seconds. The EEA stapler was fired and held clamped for 30 seconds. The stapler was released & removed. We noted 2 excellent anastomotic rings. Blue stitch is in the proximal ring.  Dr Ezzard StandingNewman did rigid proctoscopy noted the anastomosis was at 18-20 cm from the anal verge consistent with the proximal rectum.  We did a final irrigation of antibiotic solution (900 mg clindamycin/240 mg gentamicin in a liter of crystalloid) & held that for the pelvic air leak test .  The rectum was insufflated the rectum while clamping the colon proximal to that anastomosis.  There was a negative air leak test. There was no tension of mesentery or bowel at the anastomosis.   Tissues looked viable.    We changed  gloves.  The patient was re-draped.  We aspirated the antibiotic irrigation.  Hemostasis was good.   Ureters & bowel uninjured.  The anastomosis looked healthy.  The perineal defect in the right lower quadrant was not a true spiculated hernia.  It was wide open without any evidence of closure obstruction.  I left alone.   Sterile unused instruments were used from this point out per colon SSI prevention protocol.   I placed On-Q catheter and sheaths into the preperitoneal space under direct palpation.  I removed CO2 gas out through the ports.  I closed the 5mm port sites using Monocryl stitch and sterile dressing.  I closed the Pfannenstiel wound using a #1 PDS vertical peritoneal closure helping bring the rectus muscles to the midline and close some of the  mild diastases infraumbilically.  .  We did a #1 PDS transverse anterior rectal fascial closure. I closed the skin with some interrupted Monocryl stitches. I placed antibiotic-soaked wicks into the closure at the corners & centrally x4 between those areas. I placed a sterile dressing.  OnQ catheters placed & sheaths peeled away.  Patient is being extubated go to recovery room. I discussed postop care with the patient in detail the office & in the holding area. Instructions are written.  I'm about to locate family and discuss it with them as well.  [name], M.D., F.A.C.S. Gastrointestinal and Minimally Invasive Surgery [facility] [address] [place], [place] [address] [phone] Main / Paging

## 2014-03-23 NOTE — Discharge Instructions (Signed)
ABDOMINAL SURGERY: POST OP INSTRUCTIONS  1. DIET: Follow a light bland diet the first 24 hours after arrival home, such as soup, liquids, crackers, etc.  Be sure to include lots of fluids daily.  Avoid fast food or heavy meals as your are more likely to get nauseated.  Eat a low fat the next few days after surgery.   2. Take your usually prescribed home medications unless otherwise directed. 3. PAIN CONTROL: a. Pain is best controlled by a usual combination of three different methods TOGETHER: i. Ice/Heat ii. Over the counter pain medication iii. Prescription pain medication b. Most patients will experience some swelling and bruising around the incisions.  Ice packs or heating pads (30-60 minutes up to 6 times a day) will help. Use ice for the first few days to help decrease swelling and bruising, then switch to heat to help relax tight/sore spots and speed recovery.  Some people prefer to use ice alone, heat alone, alternating between ice & heat.  Experiment to what works for you.  Swelling and bruising can take several weeks to resolve.   c. It is helpful to take an over-the-counter pain medication regularly for the first few weeks.  Choose one of the following that works best for you: i. Naproxen (Aleve, etc)  Two 228m tabs twice a day ii. Ibuprofen (Advil, etc) Three 2071mtabs four times a day (every meal & bedtime) iii. Acetaminophen (Tylenol, etc) 500-65061mour times a day (every meal & bedtime) d. A  prescription for pain medication (such as oxycodone, hydrocodone, etc) should be given to you upon discharge.  Take your pain medication as prescribed.  i. If you are having problems/concerns with the prescription medicine (does not control pain, nausea, vomiting, rash, itching, etc), please call us [phone] see if we need to switch you to a different pain medicine that will work better for you and/or control your side effect better. ii. If you need a refill on your pain medication,  please contact your pharmacy.  They will contact our office to request authorization. Prescriptions will not be filled after 5 pm or on week-ends. 4. Avoid getting constipated.  Between the surgery and the pain medications, it is common to experience some constipation.  Increasing fluid intake and taking a fiber supplement (such as Metamucil, Citrucel, FiberCon, MiraLax, etc) 1-2 times a day regularly will usually help prevent this problem from occurring.  A mild laxative (prune juice, Milk of Magnesia, MiraLax, etc) should be taken according to package directions if there are no bowel movements after 48 hours.   5. Watch out for diarrhea.  If you have many loose bowel movements, simplify your diet to bland foods & liquids for a few days.  Stop any stool softeners and decrease your fiber supplement.  Switching to mild anti-diarrheal medications (Kayopectate, Pepto Bismol) can help.  If this worsens or does not improve, please call us.[place]. Wash / shower every day.  You may shower over the incision / wound.  Avoid baths until the skin is fully healed.  Continue to shower over incision(s) after the dressing is off. 7. Remove your waterproof bandages 5 days after surgery.  You may leave the incision open to air.  Remove any wicks or ribbons in your wound.  If you have an open wound, please see wound care instructions. You may replace a dressing/Band-Aid to cover the incision for comfort if you wish. 8. ACTIVITIES as tolerated:   a. You may resume regular (light)  daily activities beginning the next day--such as daily self-care, walking, climbing stairs--gradually increasing activities as tolerated.  If you can walk 30 minutes without difficulty, it is safe to try more intense activity such as jogging, treadmill, bicycling, low-impact aerobics, swimming, etc. °b. Save the most intensive and strenuous activity for last such as sit-ups, heavy lifting, contact sports, etc  Refrain from any heavy lifting or straining  until you are off narcotics for pain control.   °c. DO NOT PUSH THROUGH PAIN.  Let pain be your guide: If it hurts to do something, don't do it.  Pain is your body warning you to avoid that activity for another week until the pain goes down. °d. You may drive when you are no longer taking prescription pain medication, you can comfortably wear a seatbelt, and you can safely maneuver your car and apply brakes. °e. You may have sexual intercourse when it is comfortable.  °9. FOLLOW UP in our office °a. Please call [facility] at [phone] to set up an appointment to see your surgeon in the office for a follow-up appointment approximately 1-2 weeks after your surgery. °b. Make sure that you call for this appointment the day you arrive home to insure a convenient appointment time. °10. IF YOU HAVE DISABILITY OR FAMILY LEAVE FORMS, BRING THEM TO THE OFFICE FOR PROCESSING.  DO NOT GIVE THEM TO YOUR DOCTOR. ° ° °WHEN TO CALL US [phone]: °1. Poor pain control °2. Reactions / problems with new medications (rash/itching, nausea, etc)  °3. Fever over 101.5 F (38.5 C) °4. Inability to urinate °5. Nausea and/or vomiting °6. Worsening swelling or bruising °7. Continued bleeding from incision. °8. Increased pain, redness, or drainage from the incision ° °The clinic staff is available to answer your questions during regular business hours (8:30am-5pm).  Please don’t hesitate to call and ask to speak to one of our nurses for clinical concerns.   A surgeon from [facility] is always on call at the hospitals °  °If you have a medical emergency, go to the nearest emergency room or call 911. °  ° °[facility] °[address], [address], [place], [place]  [address] ? °MAIN: [phone] ? TOLL FREE: [phone] ? °FAX [phone] °[email or web address] ° °GETTING TO GOOD BOWEL HEALTH. °Irregular bowel habits such as constipation and diarrhea can lead to many problems over time.   Having one soft bowel movement a day is the most important way to prevent further problems.  The anorectal canal is designed to handle stretching and feces to safely manage our ability to get rid of solid waste (feces, poop, stool) out of our body.  BUT, hard constipated stools can act like ripping concrete bricks and diarrhea can be a burning fire to this very sensitive area of our body, causing inflamed hemorrhoids, anal fissures, increasing risk is perirectal abscesses, abdominal pain/bloating, an making irritable bowel worse.     °The goal: ONE SOFT BOWEL MOVEMENT A DAY!  To have soft, regular bowel movements:  °• Drink at least 8 tall glasses of water a day.   °• Take plenty of fiber.  Fiber is the undigested part of plant food that passes into the colon, acting s “natures broom” to encourage bowel motility and movement.  Fiber can absorb and hold large amounts of water. This results in a larger, bulkier stool, which is soft and easier to pass. Work gradually over several weeks up to 6 servings a day of fiber (  fiber (25g a day even more if needed) in the form of: o Vegetables -- Root (potatoes, carrots, turnips), leafy green (lettuce, salad greens, celery, spinach), or cooked high residue (cabbage, broccoli, etc) o Fruit -- Fresh (unpeeled skin & pulp), Dried (prunes, apricots, cherries, etc ),  or stewed ( applesauce)  o Whole grain breads, pasta, etc (whole wheat)  o Bran cereals   Bulking Agents -- This type of water-retaining fiber generally is easily obtained each day by one of the following:  o Psyllium bran -- The psyllium plant is remarkable because its ground seeds can retain so much water. This product is available as Metamucil, Konsyl, Effersyllium, Per Diem Fiber, or the less expensive generic preparation in drug and health food stores. Although labeled a laxative, it really is not a laxative.  o Methylcellulose -- This is another fiber derived from wood which also retains water. It is available as  Citrucel. o Polyethylene Glycol - and artificial fiber commonly called Miralax or Glycolax.  It is helpful for people with gassy or bloated feelings with regular fiber o Flax Seed - a less gassy fiber than psyllium  No reading or other relaxing activity while on the toilet. If bowel movements take longer than 5 minutes, you are too constipated  AVOID CONSTIPATION.  High fiber and water intake usually takes care of this.  Sometimes a laxative is needed to stimulate more frequent bowel movements, but   Laxatives are not a good long-term solution as it can wear the colon out. o Osmotics (Milk of Magnesia, Fleets phosphosoda, Magnesium citrate, MiraLax, GoLytely) are safer than  o Stimulants (Senokot, Castor Oil, Dulcolax, Ex Lax)    o Do not take laxatives for more than 7days in a row.   IF SEVERELY CONSTIPATED, try a Bowel Retraining Program: o Do not use laxatives.  o Eat a diet high in roughage, such as bran cereals and leafy vegetables.  o Drink six (6) ounces of prune or apricot juice each morning.  o Eat two (2) large servings of stewed fruit each day.  o Take one (1) heaping tablespoon of a psyllium-based bulking agent twice a day. Use sugar-free sweetener when possible to avoid excessive calories.  o Eat a normal breakfast.  o Set aside 15 minutes after breakfast to sit on the toilet, but do not strain to have a bowel movement.  o If you do not have a bowel movement by the third day, use an enema and repeat the above steps.   Controlling diarrhea o Switch to liquids and simpler foods for a few days to avoid stressing your intestines further. o Avoid dairy products (especially milk & ice cream) for a short time.  The intestines often can lose the ability to digest lactose when stressed. o Avoid foods that cause gassiness or bloating.  Typical foods include beans and other legumes, cabbage, broccoli, and dairy foods.  Every person has some sensitivity to other foods, so listen to our  body and avoid those foods that trigger problems for you. o Adding fiber (Citrucel, Metamucil, psyllium, Miralax) gradually can help thicken stools by absorbing excess fluid and retrain the intestines to act more normally.  Slowly increase the dose over a few weeks.  Too much fiber too soon can backfire and cause cramping & bloating. o Probiotics (such as active yogurt, Align, etc) may help repopulate the intestines and colon with normal bacteria and calm down a sensitive digestive tract.  Most studies show it to be of mild  help, though, and such products can be costly. o Medicines: - Bismuth subsalicylate (ex. Kayopectate, Pepto Bismol) every 30 minutes for up to 6 doses can help control diarrhea.  Avoid if pregnant. - Loperamide (Immodium) can slow down diarrhea.  Start with two tablets (4mg  total) first and then try one tablet every 6 hours.  Avoid if you are having fevers or severe pain.  If you are not better or start feeling worse, stop all medicines and call your doctor for advice o Call your doctor if you are getting worse or not better.  Sometimes further testing (cultures, endoscopy, X-ray studies, bloodwork, etc) may be needed to help diagnose and treat the cause of the diarrhea.  Managing Pain  Pain after surgery or related to activity is often due to strain/injury to muscle, tendon, nerves and/or incisions.  This pain is usually short-term and will improve in a few months.   Many people find it helpful to do the following things TOGETHER to help speed the process of healing and to get back to regular activity more quickly:  1. Avoid heavy physical activity a.  no lifting greater than 20 pounds b. Do not push through the pain.  Listen to your body and avoid positions and maneuvers than reproduce the pain c. Walking is okay as tolerated, but go slowly and stop when getting sore.  d. Remember: If it hurts to do it, then dont do it! 2. Take Anti-inflammatory medication  a. Take with  food/snack around the clock for 1-2 weeks i. This helps the muscle and nerve tissues become less irritable and calm down faster b. Choose ONE of the following over-the-counter medications: i. Naproxen 220mg  tabs (ex. Aleve) 1-2 pills twice a day  ii. Ibuprofen 200mg  tabs (ex. Advil, Motrin) 3-4 pills with every meal and just before bedtime iii. Acetaminophen 500mg  tabs (Tylenol) 1-2 pills with every meal and just before bedtime 3. Use a Heating pad or Ice/Cold Pack a. 4-6 times a day b. May use warm bath/hottub  or showers 4. Try Gentle Massage and/or Stretching  a. at the area of pain many times a day b. stop if you feel pain - do not overdo it  Try these steps together to help you body heal faster and avoid making things get worse.  Doing just one of these things may not be enough.    If you are not getting better after two weeks or are noticing you are getting worse, contact our office for further advice; we may need to re-evaluate you & see what other things we can do to help.   Diverticulitis Diverticulitis is inflammation or infection of small pouches in your colon that form when you have a condition called diverticulosis. The pouches in your colon are called diverticula. Your colon, or large intestine, is where water is absorbed and stool is formed. Complications of diverticulitis can include:  Bleeding.  Severe infection.  Severe pain.  Perforation of your colon.  Obstruction of your colon. CAUSES  Diverticulitis is caused by bacteria. Diverticulitis happens when stool becomes trapped in diverticula. This allows bacteria to grow in the diverticula, which can lead to inflammation and infection. RISK FACTORS People with diverticulosis are at risk for diverticulitis. Eating a diet that does not include enough fiber from fruits and vegetables may make diverticulitis more likely to develop. SYMPTOMS  Symptoms of diverticulitis may include:  Abdominal pain and tenderness. The  pain is normally located on the left side of the abdomen, but may  occur in other areas.  Fever and chills.  Bloating.  Cramping.  Nausea.  Vomiting.  Constipation.  Diarrhea.  Blood in your stool. DIAGNOSIS  Your health care provider will ask you about your medical history and do a physical exam. You may need to have tests done because many medical conditions can cause the same symptoms as diverticulitis. Tests may include:  Blood tests.  Urine tests.  Imaging tests of the abdomen, including X-rays and CT scans. When your condition is under control, your health care provider may recommend that you have a colonoscopy. A colonoscopy can show how severe your diverticula are and whether something else is causing your symptoms. TREATMENT  Most cases of diverticulitis are mild and can be treated at home. Treatment may include:  Taking over-the-counter pain medicines.  Following a clear liquid diet.  Taking antibiotic medicines by mouth for 7-10 days. More severe cases may be treated at a hospital. Treatment may include:  Not eating or drinking.  Taking prescription pain medicine.  Receiving antibiotic medicines through an IV tube.  Receiving fluids and nutrition through an IV tube.  Surgery. HOME CARE INSTRUCTIONS   Follow your health care provider's instructions carefully.  Follow a full liquid diet or other diet as directed by your health care provider. After your symptoms improve, your health care provider may tell you to change your diet. He or she may recommend you eat a high-fiber diet. Fruits and vegetables are good sources of fiber. Fiber makes it easier to pass stool.  Take fiber supplements or probiotics as directed by your health care provider.  Only take medicines as directed by your health care provider.  Keep all your follow-up appointments. SEEK MEDICAL CARE IF:   Your pain does not improve.  You have a hard time eating food.  Your bowel  movements do not return to normal. SEEK IMMEDIATE MEDICAL CARE IF:   Your pain becomes worse.  Your symptoms do not get better.  Your symptoms suddenly get worse.  You have a fever.  You have repeated vomiting.  You have bloody or black, tarry stools. MAKE SURE YOU:   Understand these instructions.  Will watch your condition.  Will get help right away if you are not doing well or get worse. Document Released: 12/25/2004 Document Revised: 03/22/2013 Document Reviewed: 02/09/2013 [facility] Patient Information 2015 [employer] [place]. This information is not intended to replace advice given to you by your health care provider. Make sure you discuss any questions you have with your health care provider.

## 2014-03-23 NOTE — Op Note (Signed)
Preoperative diagnosis:colovesical fistula Postoperative diagnosis:same  Procedure:cystoscopy, bilateral retrograde pyelogram with fluoroscopic interpretation and bilateral ureteral catheter placement   Surgeon: Valetta Fulleravid S. Emilya Justen M.D.  Anesthesia: Gen.  Indications:Mister Rder is undergoing surgical correction of a colovesical fistula. Dr. Michaell CowingGross requested bilateral ureteral stent placement for ureteral identification during his colovesical fistula repair.     Technique and findings:patient was brought to the operating room where successful induction of general anesthesia.  He was placed in lithotomy position and prepped and draped usual manner.  Appropriate surgical timeout was performed.  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  Urine was cloudy with diffuse mucosal inflammation consistent with chronic cystitis.  The right ureteral orifice was cannulated with an open-ended catheter.  Retrograde pyelogram showed significant tortuosity of the distal left ureter.this was done with fluoroscopic interpretation.  A guidewire was placed through the catheter to the renal pelvis and the stent was advanced up to the renal pelvis.  The left ureter was then identified.  Again there was considerable tortuosity in the distal to mid ureter on the left.  Retrograde polygrams showed no filling defects but mild dilation of the more proximal ureter.  A open-ended stent was again placed in the left renal pelvis.  A Foley catheter was then inserted and all 3 catheters secured together to a drainage bag.

## 2014-03-24 LAB — BASIC METABOLIC PANEL
ANION GAP: 6 (ref 5–15)
BUN: 22 mg/dL (ref 6–23)
CHLORIDE: 110 meq/L (ref 96–112)
CO2: 22 mmol/L (ref 19–32)
Calcium: 7.8 mg/dL — ABNORMAL LOW (ref 8.4–10.5)
Creatinine, Ser: 1.59 mg/dL — ABNORMAL HIGH (ref 0.50–1.35)
GFR calc Af Amer: 48 mL/min — ABNORMAL LOW (ref >90)
GFR calc non Af Amer: 41 mL/min — ABNORMAL LOW (ref >90)
Glucose, Bld: 129 mg/dL — ABNORMAL HIGH (ref 70–99)
POTASSIUM: 4.7 mmol/L (ref 3.5–5.1)
SODIUM: 138 mmol/L (ref 135–145)

## 2014-03-24 LAB — CBC
HEMATOCRIT: 31.1 % — AB (ref 39.0–52.0)
HEMOGLOBIN: 10.1 g/dL — AB (ref 13.0–17.0)
MCH: 29 pg (ref 26.0–34.0)
MCHC: 32.5 g/dL (ref 30.0–36.0)
MCV: 89.4 fL (ref 78.0–100.0)
Platelets: 177 10*3/uL (ref 150–400)
RBC: 3.48 MIL/uL — AB (ref 4.22–5.81)
RDW: 14.5 % (ref 11.5–15.5)
WBC: 6.9 10*3/uL (ref 4.0–10.5)

## 2014-03-24 LAB — GLUCOSE, CAPILLARY
GLUCOSE-CAPILLARY: 130 mg/dL — AB (ref 70–99)
Glucose-Capillary: 120 mg/dL — ABNORMAL HIGH (ref 70–99)
Glucose-Capillary: 137 mg/dL — ABNORMAL HIGH (ref 70–99)
Glucose-Capillary: 123 mg/dL — ABNORMAL HIGH (ref 70–99)

## 2014-03-24 LAB — MAGNESIUM: Magnesium: 1.6 mg/dL (ref 1.5–2.5)

## 2014-03-24 NOTE — Progress Notes (Addendum)
General Surgery Note  LOS: 1 day  POD -  1 Day Post-Op  Assessment/Plan: 1.  APPENDECTOMY, RIGID PROCTOSCOPY, LAPAROSCOPIC LEFT  COLECTOMY, SPLENIC FLEXURE MOBILIZATION,  APPENDECTOMY, REPAIR BLADDER, COLOVESICAL FISTULA, CYSTOSCOPY WITH BILATERAL URETERAL STENT PLACEMENT - 03/23/2014 - S. Verdis Koval  On Entereg  Doing well - continue current course  2.  Tremor 3.  DM 4.  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  Burping some.  Wife in room. Objective:   Filed Vitals:   03/24/14 0540  BP: 128/58  Pulse: 76  Temp: 98.1 F (36.7 C)  Resp: 16     Intake/Output from previous day:  12/24 0701 - 12/25 0700 In: 6716.3 [P.O.:1340; I.V.:5376.3] Out: 1900 [Urine:1800; Blood:100]  Intake/Output this shift:      Physical Exam:   General: WN older WM who is alert and oriented.    HEENT: Normal. Pupils equal. .   Lungs: Clear.  IS about 800.  Needs to work on this some.   Abdomen: Soft, but quiet.   Wound: Clean.  Has OnQ pump.     Lab Results:    Recent Labs  03/24/14 0429  WBC 6.9  HGB 10.1*  HCT 31.1*  PLT 177    BMET   Recent Labs  03/24/14 0429  NA 138  K 4.7  CL 110  CO2 22  GLUCOSE 129*  BUN 22  CREATININE 1.59*  CALCIUM 7.8*    PT/INR  No results for input(s): LABPROT, INR in the last 72 hours.  ABG  No results for input(s): PHART, HCO3 in the last 72 hours.  Invalid input(s): PCO2, PO2   Studies/Results:  Dg C-arm 1-60 Min-no Report  03/23/2014   CLINICAL DATA: STENTS BILATERAL URETERAL   C-ARM 1-60 MINUTES  Fluoroscopy was utilized by the requesting physician.  No radiographic  interpretation.      Anti-infectives:   Anti-infectives    Start     Dose/Rate Route Frequency Ordered Stop   03/23/14 2000  cefoTEtan (CEFOTAN) 2  g in dextrose 5 % 50 mL IVPB     2 g100 mL/hr over 30 Minutes Intravenous Every 12 hours 03/23/14 1433 03/23/14 2114   03/23/14 1130  clindamycin (CLEOCIN) 900 mg, gentamicin (GARAMYCIN) 240 mg in sodium chloride 0.9 % 1,000 mL for intraperitoneal lavage      Intraperitoneal To Surgery 03/23/14 1121 03/24/14 1130   03/23/14 0507  cefoTEtan (CEFOTAN) 2 g in dextrose 5 % 50 mL IVPB     2 g100 mL/hr over 30 Minutes Intravenous On call to O.R. 03/23/14 0507 03/23/14 0730      Ovidio Kinavid Newman, MD, FACS Pager: [phone] [facility] Office: [phone] 03/24/2014     I updated the patient's status to the patient.  Recommendations were made.  Questions were answered.  The patient expressed understanding & appreciation.  Ardeth SportsmanSteven C. Bijal Siglin, M.D., F.A.C.S. Gastrointestinal and Minimally Invasive [facility] [address] [place], [place] [id number] [phone] Main / Paging

## 2014-03-25 LAB — GLUCOSE, CAPILLARY
GLUCOSE-CAPILLARY: 104 mg/dL — AB (ref 70–99)
Glucose-Capillary: 91 mg/dL (ref 70–99)
Glucose-Capillary: 121 mg/dL — ABNORMAL HIGH (ref 70–99)
Glucose-Capillary: 112 mg/dL — ABNORMAL HIGH (ref 70–99)

## 2014-03-25 LAB — BASIC METABOLIC PANEL
Anion gap: 3 — ABNORMAL LOW (ref 5–15)
BUN: 17 mg/dL (ref 6–23)
CHLORIDE: 111 meq/L (ref 96–112)
CO2: 22 mmol/L (ref 19–32)
CREATININE: 1.29 mg/dL (ref 0.50–1.35)
Calcium: 7.9 mg/dL — ABNORMAL LOW (ref 8.4–10.5)
GFR calc Af Amer: 62 mL/min — ABNORMAL LOW (ref >90)
GFR calc non Af Amer: 53 mL/min — ABNORMAL LOW (ref >90)
Glucose, Bld: 113 mg/dL — ABNORMAL HIGH (ref 70–99)
Potassium: 4.5 mmol/L (ref 3.5–5.1)
Sodium: 136 mmol/L (ref 135–145)

## 2014-03-25 MED ORDER — SODIUM CHLORIDE 0.9 % IJ SOLN
3.0000 mL | Freq: Two times a day (BID) | INTRAMUSCULAR | Status: DC
  Administered 2014-03-25 (×2): 3 mL via INTRAVENOUS

## 2014-03-25 MED ORDER — SODIUM CHLORIDE 0.9 % IJ SOLN: 3.0000 mL | INTRAMUSCULAR | Status: DC | PRN

## 2014-03-25 MED ORDER — LACTATED RINGERS IV BOLUS (SEPSIS): 1000.0000 mL | Freq: Three times a day (TID) | INTRAVENOUS | Status: DC | PRN

## 2014-03-25 MED ORDER — SODIUM CHLORIDE 0.9 % IV SOLN: 250.0000 mL | INTRAVENOUS | Status: DC | PRN

## 2014-03-25 NOTE — Progress Notes (Signed)
[facility]  [address] [place], [place] [address] Phone: [phone] FAX: [phone]    GAR GLANCE [id number] [birth year]  CARE TEAM:  PCP: Mathews Argyle, MD  Outpatient Care Team: Patient Care Team: Lajean Manes, MD as PCP - General (Internal Medicine) Michael Boston, MD as Consulting Physician (General Surgery) Gatha Mayer, MD as Consulting Physician (Gastroenterology) Bernestine Amass, MD as Consulting Physician (Urology)  Inpatient Treatment Team: Treatment Team: Attending Provider: Michael Boston, MD; Registered Nurse: Clearence Ped, RN; Registered Nurse: Edd Arbour, RN; Registered Nurse: Harlene Ramus, RN; Technician: Vonna Drafts, [place]   Subjective:  In good spirits.  Tolerating full liquids.  Walking.  Pain controlled.  Objective:  Vital signs:  Filed Vitals:   03/24/14 1425 03/24/14 1650 03/24/14 2118 03/25/14 0556  BP: 124/58 157/68 162/63 144/60  Pulse: 70 59 74 66  Temp: 98.2 F (36.8 C) 97.5 F (36.4 C) 98.7 F (37.1 C) 97.9 F (36.6 C)  TempSrc: Oral Oral Oral Oral  Resp: 16 16 18 15   Height:      Weight:      SpO2: 94% 99% 98% 99%    Last BM Date: 03/23/14  Intake/Output   Yesterday:  12/25 0701 - 12/26 0700 In: 1806.3 [P.O.:1180; I.V.:626.3] Out: 3400 [Urine:3400] This shift:     Bowel function:  Flatus: n  BM: n  Drain: n/a  Physical Exam:  General: Pt awake/alert/oriented x4 in no acute distress Eyes: PERRL, normal EOM.  Sclera clear.  No icterus Neuro: CN II-XII intact w/o focal sensory/motor deficits. Lymph: No head/neck/groin lymphadenopathy Psych:  No delerium/psychosis/paranoia HENT: Normocephalic, Mucus membranes moist.  No thrush Neck: Supple, No tracheal deviation Chest: No chest wall pain w good excursion CV:  Pulses intact.  Regular rhythm MS: Normal AROM mjr joints.  No obvious deformity Abdomen: Soft.  Nondistended.  Mildly  tender at incisions only.  No evidence of peritonitis.  No incarcerated hernias. Ext:  SCDs BLE.  No mjr edema.  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 male  2 Days Post-Op  Procedure(s): APPENDECTOMY RIGID PROCTOSCOPY LAPAROSCOPIC LEFT  COLECTOMY, SPLENIC FLEXURE MOBILIZATION,  APPENDECTOMY, REPAIR BLADDER, COLOVESICAL FISTULA CYSTOSCOPY WITH BILATERAL URETERAL STENT PLACEMENT  Gradually recovering status post colectomy for diverticulitis and colovesical fistula  Plan:  -Advance diet per protocol.  Wean off IV fluids and follow.  Okay to remove Foley catheter.  Follow up on pathology.  Diabetic control.  Continue sliding scale insulin.  Half dose metformin for now.  Hypertension control.  GERD control.  -VTE prophylaxis- SCDs, etc  -mobilize as tolerated to help recovery  Scott Nixon, M.D., F.A.C.S. Gastrointestinal and Minimally Invasive Surgery [facility] [address] [place], [place] [address] [phone] Main / Paging   03/25/2014   Results:   Labs: Results for orders placed or performed during the hospital encounter of 03/23/14 (from the past 48 hour(s))  Glucose, capillary     Status: Abnormal   Collection Time: 03/23/14  1:02 PM  Result Value Ref Range   Glucose-Capillary 204 (H) 70 - 99 mg/dL   Comment 1 Documented in Chart   Glucose,  capillary     Status: Abnormal   Collection Time: 03/23/14  4:36 PM  Result Value Ref Range   Glucose-Capillary 221 (H) 70 - 99 mg/dL  Glucose, capillary     Status: Abnormal   Collection Time: 03/23/14  9:04 PM  Result Value Ref Range   Glucose-Capillary 134 (H) 70 - 99 mg/dL  Basic metabolic panel     Status:  Abnormal   Collection Time: 03/24/14  4:29 AM  Result Value Ref Range   Sodium 138 135 - 145 mmol/L    Comment: Please note change in reference range.   Potassium 4.7 3.5 - 5.1 mmol/L    Comment: Please note change in reference range.   Chloride 110 96 - 112 mEq/L   CO2 22 19 - 32 mmol/L   Glucose, Bld 129 (H) 70 - 99 mg/dL   BUN 22 6 - 23 mg/dL   Creatinine, Ser 1.59 (H) 0.50 - 1.35 mg/dL   Calcium 7.8 (L) 8.4 - 10.5 mg/dL   GFR calc non Af Amer 41 (L) >90 mL/min   GFR calc Af Amer 48 (L) >90 mL/min    Comment: (NOTE) The eGFR has been calculated using the CKD EPI equation. This calculation has not been validated in all clinical situations. eGFR's persistently <90 mL/min signify possible Chronic Kidney Disease.    Anion gap 6 5 - 15  CBC     Status: Abnormal   Collection Time: 03/24/14  4:29 AM  Result Value Ref Range   WBC 6.9 4.0 - 10.5 K/uL   RBC 3.48 (L) 4.22 - 5.81 MIL/uL   Hemoglobin 10.1 (L) 13.0 - 17.0 g/dL   HCT 31.1 (L) 39.0 - 52.0 %   MCV 89.4 78.0 - 100.0 fL   MCH 29.0 26.0 - 34.0 pg   MCHC 32.5 30.0 - 36.0 g/dL   RDW 14.5 11.5 - 15.5 %   Platelets 177 150 - 400 K/uL  Magnesium     Status: None   Collection Time: 03/24/14  4:29 AM  Result Value Ref Range   Magnesium 1.6 1.5 - 2.5 mg/dL  Glucose, capillary     Status: Abnormal   Collection Time: 03/24/14  7:22 AM  Result Value Ref Range   Glucose-Capillary 120 (H) 70 - 99 mg/dL  Glucose, capillary     Status: Abnormal   Collection Time: 03/24/14 12:30 PM  Result Value Ref Range   Glucose-Capillary 130 (H) 70 - 99 mg/dL  Glucose, capillary     Status: Abnormal   Collection Time: 03/24/14  3:59 PM  Result Value Ref Range   Glucose-Capillary 137 (H) 70 - 99 mg/dL  Glucose, capillary     Status: Abnormal   Collection Time: 03/24/14 10:50 PM  Result Value Ref Range   Glucose-Capillary 123 (H) 70 - 99 mg/dL   Comment 1 Notify RN   Basic metabolic panel     Status: Abnormal   Collection Time: 03/25/14   5:31 AM  Result Value Ref Range   Sodium 136 135 - 145 mmol/L    Comment: Please note change in reference range.   Potassium 4.5 3.5 - 5.1 mmol/L    Comment: Please note change in reference range.   Chloride 111 96 - 112 mEq/L   CO2 22 19 - 32 mmol/L   Glucose, Bld 113 (H) 70 - 99 mg/dL   BUN 17 6 - 23 mg/dL   Creatinine, Ser 1.29 0.50 - 1.35 mg/dL   Calcium 7.9 (L) 8.4 -  10.5 mg/dL   GFR calc non Af Amer 53 (L) >90 mL/min   GFR calc Af Amer 62 (L) >90 mL/min    Comment: (NOTE) The eGFR has been calculated using the CKD EPI equation. This calculation has not been validated in all clinical situations. eGFR's persistently <90 mL/min signify possible Chronic Kidney Disease.    Anion gap 3 (L) 5 - 15  Glucose, capillary     Status: None   Collection Time: 03/25/14  7:35 AM  Result Value Ref Range   Glucose-Capillary 91 70 - 99 mg/dL   Comment 1 Documented in Chart    Comment 2 Notify RN     Imaging / Studies: Dg C-arm 1-60 Min-no Report  03/23/2014   CLINICAL DATA: STENTS BILATERAL URETERAL   C-ARM 1-60 MINUTES  Fluoroscopy was utilized by the requesting physician.  No radiographic  interpretation.     Medications / Allergies: per chart  Antibiotics: Anti-infectives    Start     Dose/Rate Route Frequency Ordered Stop   03/23/14 2000  cefoTEtan (CEFOTAN) 2 g in dextrose 5 % 50 mL IVPB     2 g100 mL/hr over 30 Minutes Intravenous Every 12 hours 03/23/14 1433 03/23/14 2114   03/23/14 1130  clindamycin (CLEOCIN) 900 mg, gentamicin (GARAMYCIN) 240 mg in sodium chloride 0.9 % 1,000 mL for intraperitoneal lavage      Intraperitoneal To Surgery 03/23/14 1121 03/24/14 1130   03/23/14 0507  cefoTEtan (CEFOTAN) 2 g in dextrose 5 % 50 mL IVPB     2 g100 mL/hr over 30 Minutes Intravenous On call to O.R. 03/23/14 0507 03/23/14 0730       Note: Portions of this report may have been transcribed using voice recognition software. Every effort was made to ensure accuracy; however,  inadvertent computerized transcription errors may be present.   Any transcriptional errors that result from this process are unintentional.

## 2014-03-25 NOTE — Progress Notes (Signed)
Patient reports voiding and passing flatus.Scott Nixon, [name]

## 2014-03-26 LAB — GLUCOSE, CAPILLARY: Glucose-Capillary: 103 mg/dL — ABNORMAL HIGH (ref 70–99)

## 2014-03-26 MED ORDER — OXYCODONE HCL 5 MG PO TABS
5.0000 mg | ORAL_TABLET | ORAL | Status: DC | PRN
  Administered 2014-03-26: 10 mg via ORAL
  Filled 2014-03-26: qty 2

## 2014-03-26 NOTE — Consult Note (Signed)
WOC follow-up: Pt did not receive an ostomy during surgery. Wound care orders are by [facility]. [facility]. Please re-consult if further assistance is needed.  Thank-you,  Cammie Mcgeeawn Elizet Kaplan MSN, RN, CWOCN, [place]AP, CNS [phone]

## 2014-03-26 NOTE — Discharge Summary (Signed)
Physician Discharge Summary  Patient ID: Scott Nixon MRN: [id number] DOB/AGE: [birth year] 73 y.o.  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 Active Problems:   Benign essential tremor   Diabetes mellitus, type 2   Acid reflux   BP (high blood pressure)   PRE-OPERATIVE DIAGNOSIS: 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  Postoperatively, the patient gradually mobilized and advanced to a solid diet.  Pain and other symptoms were treated aggressively.    By the time of discharge, the patient was walking well the hallways, eating solid food, having flatus & BMs.  Pain was well-controlled on an oral medications.  Based on meeting discharge criteria and continuing to recover, I felt it was safe for the patient to be discharged from the hospital to further recover with close followup on POD#3. Postoperative recommendations were discussed in detail.  They are  written as well.  Patient and his wife expressed understanding and appreciation.  Discussed with his nurse as well.   Significant Diagnostic Studies:  Results for orders placed or performed during the hospital encounter of 03/23/14 (from the past 72 hour(s))  Glucose, capillary     Status: Abnormal   Collection Time: 03/23/14  1:02 PM  Result Value Ref Range   Glucose-Capillary 204 (H) 70 - 99 mg/dL   Comment 1 Documented in Chart   Glucose, capillary     Status: Abnormal   Collection Time: 03/23/14  4:36 PM  Result Value Ref Range   Glucose-Capillary 221 (H) 70 - 99 mg/dL  Glucose, capillary     Status: Abnormal   Collection Time: 03/23/14  9:04 PM  Result Value Ref Range   Glucose-Capillary 134 (H) 70 - 99 mg/dL  Basic metabolic panel     Status: Abnormal   Collection Time: 03/24/14  4:29 AM  Result Value Ref Range   Sodium 138 135 - 145 mmol/L    Comment: Please note change in reference range.   Potassium 4.7 3.5 - 5.1 mmol/L    Comment: Please note change in reference range.   Chloride 110 96 - 112 mEq/L   CO2 22 19 - 32 mmol/L   Glucose, Bld 129 (H) 70 - 99 mg/dL   BUN 22 6 - 23 mg/dL   Creatinine, Ser 1.59 (H) 0.50 - 1.35 mg/dL   Calcium 7.8 (L) 8.4 - 10.5 mg/dL   GFR calc non Af Amer 41 (L) >90 mL/min   GFR calc Af Amer 48 (L) >90 mL/min    Comment: (NOTE) The eGFR has been calculated using the CKD EPI equation. This calculation has not been validated in all clinical  situations. eGFR's persistently <90 mL/min signify possible Chronic Kidney Disease.    Anion gap 6 5 - 15  CBC     Status: Abnormal   Collection Time: 03/24/14  4:29 AM  Result Value Ref Range   WBC 6.9 4.0 - 10.5 K/uL   RBC 3.48 (L) 4.22 - 5.81 MIL/uL   Hemoglobin 10.1 (L) 13.0 - 17.0 g/dL   HCT 31.1 (L) 39.0 - 52.0 %   MCV 89.4 78.0 - 100.0 fL   MCH 29.0 26.0 - 34.0 pg   MCHC 32.5 30.0 - 36.0 g/dL   RDW 14.5 11.5 - 15.5 %   Platelets 177 150 - 400 K/uL  Magnesium     Status: None    Collection Time: 03/24/14  4:29 AM  Result Value Ref Range   Magnesium 1.6 1.5 - 2.5 mg/dL  Glucose, capillary     Status: Abnormal   Collection Time: 03/24/14  7:22 AM  Result Value Ref Range   Glucose-Capillary 120 (H) 70 - 99 mg/dL  Glucose, capillary     Status: Abnormal   Collection Time: 03/24/14 12:30 PM  Result Value Ref Range   Glucose-Capillary 130 (H) 70 - 99 mg/dL  Glucose, capillary     Status: Abnormal   Collection Time: 03/24/14  3:59 PM  Result Value Ref Range   Glucose-Capillary 137 (H) 70 - 99 mg/dL  Glucose, capillary     Status: Abnormal   Collection Time: 03/24/14 10:50 PM  Result Value Ref Range   Glucose-Capillary 123 (H) 70 - 99 mg/dL   Comment 1 Notify RN   Basic metabolic panel     Status: Abnormal   Collection Time: 03/25/14  5:31 AM  Result Value Ref Range   Sodium 136 135 - 145 mmol/L    Comment: Please note change in reference range.   Potassium 4.5 3.5 - 5.1 mmol/L    Comment: Please note change in reference range.   Chloride 111 96 - 112 mEq/L   CO2 22 19 - 32 mmol/L   Glucose, Bld 113 (H) 70 - 99 mg/dL   BUN 17 6 - 23 mg/dL   Creatinine, Ser 1.29 0.50 - 1.35 mg/dL   Calcium 7.9 (L) 8.4 - 10.5 mg/dL   GFR calc non Af Amer 53 (L) >90 mL/min   GFR calc Af Amer 62 (L) >90 mL/min    Comment: (NOTE) The eGFR has been calculated using the CKD EPI equation. This calculation has not been validated in all clinical situations. eGFR's persistently <90 mL/min signify possible Chronic Kidney Disease.    Anion gap 3 (L) 5 - 15  Glucose, capillary     Status: None   Collection Time: 03/25/14  7:35 AM  Result Value Ref Range   Glucose-Capillary 91 70 - 99 mg/dL   Comment 1 Documented in Chart    Comment 2 Notify RN   Glucose, capillary     Status: Abnormal   Collection Time: 03/25/14 12:12 PM  Result Value Ref Range   Glucose-Capillary 104 (H) 70 - 99 mg/dL   Comment 1 Documented in Chart    Comment 2 Notify RN   Glucose, capillary     Status:  Abnormal   Collection Time: 03/25/14  4:45 PM  Result Value Ref Range   Glucose-Capillary 121 (H) 70 - 99 mg/dL   Comment 1 Documented in Chart    Comment 2 Notify RN   Glucose, capillary     Status: Abnormal   Collection Time:  03/25/14  9:43 PM  Result Value Ref Range   Glucose-Capillary 112 (H) 70 - 99 mg/dL   Comment 1 Notify RN   Glucose, capillary     Status: Abnormal   Collection Time: 03/26/14  7:47 AM  Result Value Ref Range   Glucose-Capillary 103 (H) 70 - 99 mg/dL   Comment 1 Glucose Stabilizer    Comment 2 Hypo Protocol    Comment 3 Documented in Chart     Dg C-arm 1-60 Min-no Report  03/23/2014   CLINICAL DATA: STENTS BILATERAL URETERAL   C-ARM 1-60 MINUTES  Fluoroscopy was utilized by the requesting physician.  No radiographic  interpretation.     Discharge Exam: Blood pressure 149/59, pulse 59, temperature 97.9 F (36.6 C), temperature source Oral, resp. rate 16, height 5' 10"  (1.778 m), weight 234 lb 1.6 oz (106.187 kg), SpO2 99 %.  General: Pt awake/alert/oriented x4 in no major acute distress.  Smiling.  Joking.  Relaxed. Eyes: PERRL, normal EOM. Sclera nonicteric Neuro: CN II-XII intact w/o focal sensory/motor deficits. Lymph: No head/neck/groin lymphadenopathy Psych:  No delerium/psychosis/paranoia HENT: Normocephalic, Mucus membranes moist.  No thrush Neck: Supple, No tracheal deviation Chest: No pain.  Good respiratory excursion. CV:  Pulses intact.  Regular rhythm MS: Normal AROM mjr joints.  No obvious deformity Abdomen: Soft, Nondistended.  Min tender at incsions.  No incarcerated hernias. Ext:  SCDs BLE.  No significant edema.  No cyanosis Skin: No petechiae / purpura  Discharged Condition: good   Past Medical History  Diagnosis Date  . Diabetes mellitus without complication     metformin  . GERD (gastroesophageal reflux disease)   . Gout   . Hypercholesterolemia   . HTN (hypertension)   . Sleep apnea   . Diverticulosis   .  Nephrolithiasis   . Chronic cystitis   . Abdominal hernia   . Colovesical fistula   . Allergy   . Cataract   . Headache   . Anemia     Past Surgical History  Procedure Laterality Date  . Right cornea transplant  1964, 1998  . Hernia repair Right 1950  . Eye surgery Right 1963    eyelid  . Rotator cuff repair Left 2005  . Arm surgery Left 1988    torn ligament  . Colonoscopy N/A 03/22/2014    Procedure: COLONOSCOPY;  Surgeon: Inda Castle, MD;  Location: [facility];  Service: Endoscopy;  Laterality: N/A;    History   Social History  . Marital Status: Significant Other    Spouse Name: N/A    Number of Children: 1  . Years of Education: N/A   Occupational History  . Owner     [employer]   Social History Main Topics  . Smoking status: Never Smoker   . Smokeless tobacco: Never Used  . Alcohol Use: No  . Drug Use: No  . Sexual Activity: Not Currently   Other Topics Concern  . Not on file   Social History Narrative    Family History  Problem Relation Age of Onset  . Heart failure Father   . Emphysema Mother   . Colon cancer Neg Hx     Current Facility-Administered Medications  Medication Dose Route Frequency Provider Last Rate Last Dose  . 0.9 %  sodium chloride infusion  250 mL Intravenous PRN Michael Boston, MD      . acetaminophen (TYLENOL) tablet 1,000 mg  1,000 mg Oral TID Michael Boston, MD   1,000 mg at 03/25/14  2154  . alum & mag hydroxide-simeth (MAALOX/MYLANTA) 200-200-20 MG/5ML suspension 30 mL  30 mL Oral Q6H PRN Michael Boston, MD      . alvimopan (ENTEREG) capsule 12 mg  12 mg Oral BID Michael Boston, MD   12 mg at 03/25/14 2154  . aspirin EC tablet 81 mg  81 mg Oral Daily Michael Boston, MD   81 mg at 03/25/14 1100  . atenolol (TENORMIN) tablet 25 mg  25 mg Oral Daily Michael Boston, MD   25 mg at 03/25/14 1100  . cyanocobalamin tablet 500 mcg  500 mcg Oral Daily Michael Boston, MD   500 mcg at 03/25/14 1100  . diphenhydrAMINE (BENADRYL) 12.5  MG/5ML elixir 12.5 mg  12.5 mg Oral Q6H PRN Michael Boston, MD   12.5 mg at 03/24/14 1316   Or  . diphenhydrAMINE (BENADRYL) injection 12.5 mg  12.5 mg Intravenous Q6H PRN Michael Boston, MD      . esomeprazole (NEXIUM) capsule 40 mg  40 mg Oral Daily PRN Michael Boston, MD      . heparin injection 5,000 Units  5,000 Units Subcutaneous 3 times per day Michael Boston, MD   5,000 Units at 03/26/14 0505  . HYDROmorphone (DILAUDID) injection 0.5-2 mg  0.5-2 mg Intravenous Q1H PRN Michael Boston, MD   1 mg at 03/26/14 0505  . insulin aspart (novoLOG) injection 0-15 Units  0-15 Units Subcutaneous TID WC Michael Boston, MD   2 Units at 03/24/14 1744  . insulin aspart (novoLOG) injection 0-5 Units  0-5 Units Subcutaneous QHS Michael Boston, MD   0 Units at 03/23/14 2140  . irbesartan (AVAPRO) tablet 75 mg  75 mg Oral Daily Michael Boston, MD   75 mg at 03/25/14 1100  . lactated ringers bolus 1,000 mL  1,000 mL Intravenous Once Michael Boston, MD      . lactated ringers bolus 1,000 mL  1,000 mL Intravenous Q8H PRN Michael Boston, MD      . lip balm (CARMEX) ointment 1 application  1 application Topical BID Michael Boston, MD   1 application at 22/02/54 2154  . magic mouthwash  15 mL Oral QID PRN Michael Boston, MD      . menthol-cetylpyridinium (CEPACOL) lozenge 3 mg  1 lozenge Oral PRN Michael Boston, MD      . metFORMIN (GLUCOPHAGE) tablet 500 mg  500 mg Oral Q breakfast Michael Boston, MD   500 mg at 03/26/14 0825  . metoprolol (LOPRESSOR) injection 5 mg  5 mg Intravenous Q6H PRN Michael Boston, MD      . metoprolol tartrate (LOPRESSOR) tablet 12.5 mg  12.5 mg Oral Q12H PRN Michael Boston, MD      . ondansetron [facility]) tablet 4 mg  4 mg Oral Q6H PRN Michael Boston, MD   4 mg at 03/24/14 2706   Or  . ondansetron [facility]) injection 4 mg  4 mg Intravenous Q6H PRN Michael Boston, MD      . oxyCODONE (Oxy IR/ROXICODONE) immediate release tablet 5-10 mg  5-10 mg Oral Q4H PRN Michael Boston, MD      . phenol [facility]) mouth spray 2 spray   2 spray Mouth/Throat PRN Michael Boston, MD   2 spray at 03/23/14 2044  . promethazine (PHENERGAN) injection 6.25-12.5 mg  6.25-12.5 mg Intravenous Q4H PRN Michael Boston, MD      . saccharomyces boulardii (FLORASTOR) capsule 250 mg  250 mg Oral BID Michael Boston, MD   250 mg at 03/25/14 2231  . sodium chloride  0.9 % injection 3 mL  3 mL Intravenous Q12H Michael Boston, MD   3 mL at 03/25/14 2155  . sodium chloride 0.9 % injection 3 mL  3 mL Intravenous PRN Michael Boston, MD      . tamsulosin [facility]) capsule 0.4 mg  0.4 mg Oral Daily Michael Boston, MD   0.4 mg at 03/25/14 1100  . vitamin C (ASCORBIC ACID) tablet 500 mg  500 mg Oral Daily Michael Boston, MD   500 mg at 03/25/14 1100  . zolpidem (AMBIEN) tablet 5 mg  5 mg Oral QHS PRN Michael Boston, MD   5 mg at 03/24/14 2204     No Known Allergies  Disposition: 01-Home or Self Care  Discharge Instructions    Call MD for:  extreme fatigue    Complete by:  As directed      Call MD for:  extreme fatigue    Complete by:  As directed      Call MD for:  hives    Complete by:  As directed      Call MD for:  hives    Complete by:  As directed      Call MD for:  persistant nausea and vomiting    Complete by:  As directed      Call MD for:  persistant nausea and vomiting    Complete by:  As directed      Call MD for:  redness, tenderness, or signs of infection (pain, swelling, redness, odor or green/yellow discharge around incision site)    Complete by:  As directed      Call MD for:  redness, tenderness, or signs of infection (pain, swelling, redness, odor or green/yellow discharge around incision site)    Complete by:  As directed      Call MD for:  severe uncontrolled pain    Complete by:  As directed      Call MD for:  severe uncontrolled pain    Complete by:  As directed      Call MD for:    Complete by:  As directed   Temperature > 101.14F     Call MD for:    Complete by:  As directed   Temperature > 101.14F     Diet - low sodium heart healthy     Complete by:  As directed      Discharge instructions    Complete by:  As directed   Please see discharge instruction sheets.  Also refer to handout given an office.  Please call our office if you have any questions or concerns [phone]     Discharge instructions    Complete by:  As directed   Please see discharge instruction sheets.  Also refer to handout given an office.  Please call our office if you have any questions or concerns [phone]     Discharge wound care:    Complete by:  As directed   If you have closed incisions, shower and bathe over these incisions with soap and water every day.  Remove all surgical dressings on postoperative day #3.  You do not need to replace dressings over the closed incisions unless you feel more comfortable with a Band-Aid covering it.   If you have an open wound that requires packing, please see wound care instructions.  In general, remove all dressings, wash wound with soap and water and then replace with saline moistened gauze.  Do the dressing change at least  every day.  Please call our office [phone] if you have further questions.     Discharge wound care:    Complete by:  As directed   If you have closed incisions, shower and bathe over these incisions with soap and water every day.  Remove all surgical dressings on postoperative day #3.  You do not need to replace dressings over the closed incisions unless you feel more comfortable with a Band-Aid covering it.   If you have an open wound that requires packing, please see wound care instructions.  In general, remove all dressings, wash wound with soap and water and then replace with saline moistened gauze.  Do the dressing change at least every day.  Please call our office [phone] if you have further questions.     Driving Restrictions    Complete by:  As directed   No driving until off narcotics and can safely swerve away without pain during an emergency     Driving  Restrictions    Complete by:  As directed   No driving until off narcotics and can safely swerve away without pain during an emergency     Increase activity slowly    Complete by:  As directed   Walk an hour a day.  Use 20-30 minute walks.  When you can walk 30 minutes without difficulty, increase to low impact/moderate activities such as biking, jogging, swimming, sexual activity..  Eventually can increase to unrestricted activity when not feeling pain.  If you feel pain: STOP!Marland Kitchen   Let pain protect you from overdoing it.  Use ice/heat/over-the-counter pain medications to help minimize his soreness.  Use pain prescriptions as needed to remain active.  It is better to take extra pain medications and be more active than to stay bedridden to avoid all pain medications.     Increase activity slowly    Complete by:  As directed   Walk an hour a day.  Use 20-30 minute walks.  When you can walk 30 minutes without difficulty, increase to low impact/moderate activities such as biking, jogging, swimming, sexual activity..  Eventually can increase to unrestricted activity when not feeling pain.  If you feel pain: STOP!Marland Kitchen   Let pain protect you from overdoing it.  Use ice/heat/over-the-counter pain medications to help minimize his soreness.  Use pain prescriptions as needed to remain active.  It is better to take extra pain medications and be more active than to stay bedridden to avoid all pain medications.     Lifting restrictions    Complete by:  As directed   Avoid heavy lifting initially.  Do not push through pain.  You have no specific weight limit.  Coughing and sneezing or four more stressful to your incision than any lifting you will do. Pain will protect you from injury.  Therefore, avoid intense activity until off all narcotic pain medications.  Coughing and sneezing or four more stressful to your incision than any lifting he will do.     Lifting restrictions    Complete by:  As directed   Avoid heavy  lifting initially.  Do not push through pain.  You have no specific weight limit.  Coughing and sneezing or four more stressful to your incision than any lifting you will do. Pain will protect you from injury.  Therefore, avoid intense activity until off all narcotic pain medications.  Coughing and sneezing or four more stressful to your incision than any lifting he will do.     May  shower / Bathe    Complete by:  As directed      May shower / Bathe    Complete by:  As directed      May walk up steps    Complete by:  As directed      May walk up steps    Complete by:  As directed      Sexual Activity Restrictions    Complete by:  As directed   Sexual activity as tolerated.  Do not push through pain.  Pain will protect you from injury.     Sexual Activity Restrictions    Complete by:  As directed   Sexual activity as tolerated.  Do not push through pain.  Pain will protect you from injury.     Walk with assistance    Complete by:  As directed   Walk over an hour a day.  May use a walker/cane/companion to help with balance and stamina.     Walk with assistance    Complete by:  As directed   Walk over an hour a day.  May use a walker/cane/companion to help with balance and stamina.            Medication List    STOP taking these medications        ibuprofen 200 MG tablet  Commonly known as:  ADVIL,MOTRIN      TAKE these medications        ALEVE PM 220-25 MG Tabs  Generic drug:  Naproxen Sod-Diphenhydramine  Take 1-2 tablets by mouth at bedtime. For sleep.     aspirin EC 81 MG tablet  Take 81 mg by mouth daily.     atenolol 25 MG tablet  Commonly known as:  TENORMIN  Take 25 mg by mouth daily.     cyanocobalamin 500 MCG tablet  Take 500 mcg by mouth daily.     Fish Oil 1200 MG Caps  Take 1,200 mg by mouth once a week.     IRON PO  Take 1 tablet by mouth daily.     metFORMIN 500 MG tablet  Commonly known as:  GLUCOPHAGE  Take 1,000 mg by mouth daily with  breakfast.     NEXIUM 40 MG packet  Generic drug:  esomeprazole  Take 40 mg by mouth daily as needed (Acid reflux).     oxyCODONE 5 MG immediate release tablet  Commonly known as:  Oxy IR/ROXICODONE  Take 1-2 tablets (5-10 mg total) by mouth every 4 (four) hours as needed for moderate pain, severe pain or breakthrough pain.     REFRESH OP  Place 1 drop into both eyes daily as needed (dry eyes).     tamsulosin 0.4 MG Caps capsule  Commonly known as:  FLOMAX  Take 0.4 mg by mouth daily.     valsartan 320 MG tablet  Commonly known as:  DIOVAN  Take 320 mg by mouth daily.     vitamin C 500 MG tablet  Commonly known as:  ASCORBIC ACID  Take 500 mg by mouth daily.     VITAMIN D PO  Take 1,000 Units by mouth daily.     ZETIA 10 MG tablet  Generic drug:  ezetimibe  Take 10 mg by mouth daily.           Follow-up Information    Follow up with Cristobal Advani C., MD In 2 weeks.   Specialty:  General Surgery   Why:  To follow up after your  operation, To follow up after your hospital stay   Contact information:   [place] [address] [phone]        Signed: Morton Peters, M.D., F.A.C.S. Gastrointestinal and Minimally Invasive Surgery [facility] [address] [place], [place] [address] [phone] Main / Paging   03/26/2014, 8:53 AM

## 2014-03-27 ENCOUNTER — Encounter (HOSPITAL_COMMUNITY): Payer: Self-pay | Admitting: Surgery

## 2014-03-28 NOTE — Progress Notes (Signed)
Patient informed of pathology and is still having moderate discomfort with pain.  Instructed to keep taking medication and substitute in some ibuprofen and ice packs to help with swelling.

## 2014-04-10 NOTE — Progress Notes (Signed)
PJ--has this pt ever been set up with the surgeons etc?

## 2014-04-10 NOTE — Progress Notes (Signed)
Yes patient had his surgery over the holidays.

## 2014-06-24 ENCOUNTER — Encounter (HOSPITAL_COMMUNITY): Payer: Self-pay

## 2014-06-24 ENCOUNTER — Emergency Department (HOSPITAL_COMMUNITY)
Admission: EM | Admit: 2014-06-24 | Discharge: 2014-06-24 | Disposition: A | Discharge status: Home / Self Care | Payer: Medicare Other | Attending: Emergency Medicine | Admitting: Emergency Medicine

## 2014-06-24 DIAGNOSIS — K219 Gastro-esophageal reflux disease without esophagitis: Secondary | ICD-10-CM | POA: Insufficient documentation

## 2014-06-24 DIAGNOSIS — Z8669 Personal history of other diseases of the nervous system and sense organs: Secondary | ICD-10-CM | POA: Diagnosis not present

## 2014-06-24 DIAGNOSIS — Z862 Personal history of diseases of the blood and blood-forming organs and certain disorders involving the immune mechanism: Secondary | ICD-10-CM | POA: Diagnosis not present

## 2014-06-24 DIAGNOSIS — Z87448 Personal history of other diseases of urinary system: Secondary | ICD-10-CM | POA: Diagnosis not present

## 2014-06-24 DIAGNOSIS — Z79899 Other long term (current) drug therapy: Secondary | ICD-10-CM | POA: Insufficient documentation

## 2014-06-24 DIAGNOSIS — R319 Hematuria, unspecified: Secondary | ICD-10-CM | POA: Diagnosis not present

## 2014-06-24 DIAGNOSIS — I10 Essential (primary) hypertension: Secondary | ICD-10-CM | POA: Insufficient documentation

## 2014-06-24 DIAGNOSIS — Z87442 Personal history of urinary calculi: Secondary | ICD-10-CM | POA: Insufficient documentation

## 2014-06-24 DIAGNOSIS — M109 Gout, unspecified: Secondary | ICD-10-CM | POA: Insufficient documentation

## 2014-06-24 DIAGNOSIS — Z7982 Long term (current) use of aspirin: Secondary | ICD-10-CM | POA: Insufficient documentation

## 2014-06-24 DIAGNOSIS — E119 Type 2 diabetes mellitus without complications: Secondary | ICD-10-CM | POA: Insufficient documentation

## 2014-06-24 LAB — URINALYSIS, ROUTINE W REFLEX MICROSCOPIC
Bilirubin Urine: NEGATIVE
Glucose, UA: NEGATIVE mg/dL
Ketones, ur: NEGATIVE mg/dL
LEUKOCYTES UA: NEGATIVE
Nitrite: NEGATIVE
SPECIFIC GRAVITY, URINE: 1.021 (ref 1.005–1.030)
Urobilinogen, UA: 0.2 mg/dL (ref 0.0–1.0)
pH: 6 (ref 5.0–8.0)

## 2014-06-24 LAB — URINE MICROSCOPIC-ADD ON

## 2014-06-24 NOTE — ED Notes (Signed)
Pt alert, oriented, and ambulatory upon DC. He was advised to follow up with urology in 2 days

## 2014-06-24 NOTE — ED Notes (Signed)
Urinal provided for urine specimen.

## 2014-06-24 NOTE — ED Notes (Signed)
MD Walden at bedside 

## 2014-06-24 NOTE — ED Provider Notes (Signed)
CSN: [id number]     Arrival date & time 06/24/14  1627 History   First MD Initiated Contact with Patient 06/24/14 1631     Chief Complaint  Patient presents with  . Hematuria     (Consider location/radiation/quality/duration/timing/severity/associated sxs/prior Treatment) Patient is a 74 y.o. male presenting with hematuria. The history is provided by the patient.  Hematuria This is a new problem. The current episode started 3 to 5 hours ago. The problem occurs constantly. The problem has not changed since onset.Pertinent negatives include no abdominal pain and no shortness of breath. Nothing aggravates the symptoms. Nothing relieves the symptoms. He has tried nothing for the symptoms.    Past Medical History  Diagnosis Date  . Diabetes mellitus without complication     metformin  . GERD (gastroesophageal reflux disease)   . Gout   . Hypercholesterolemia   . HTN (hypertension)   . Sleep apnea   . Diverticulosis   . Nephrolithiasis   . Chronic cystitis   . Abdominal hernia   . Colovesical fistula   . Allergy   . Cataract   . Headache   . Anemia    Past Surgical History  Procedure Laterality Date  . Right cornea transplant  1964, 1998  . Hernia repair Right 1950  . Eye surgery Right 1963    eyelid  . Rotator cuff repair Left 2005  . Arm surgery Left 1988    torn ligament  . Colonoscopy N/A 03/22/2014    Procedure: COLONOSCOPY;  Surgeon: Louis Meckelobert D Kaplan, MD;  Location: [facility];  Service: Endoscopy;  Laterality: N/A;  . Appendectomy N/A 03/23/2014    Procedure: APPENDECTOMY;  Surgeon: Karie SodaSteven Gross, MD;  Location: [facility];  Service: General;  Laterality: N/A;  . Proctoscopy N/A 03/23/2014    Procedure: RIGID PROCTOSCOPY;  Surgeon: Karie SodaSteven Gross, MD;  Location: [facility];  Service: General;  Laterality: N/A;  . Laparoscopic partial colectomy N/A 03/23/2014    Procedure: LAPAROSCOPIC LEFT  COLECTOMY, SPLENIC FLEXURE MOBILIZATION,  APPENDECTOMY, REPAIR BLADDER, COLOVESICAL  FISTULA;  Surgeon: Karie SodaSteven Gross, MD;  Location: [facility];  Service: General;  Laterality: N/A;  . Cystoscopy with stent placement Bilateral 03/23/2014    Procedure: CYSTOSCOPY WITH BILATERAL URETERAL CATHETER  PLACEMENT;  Surgeon: Valetta Fulleravid S Grapey, MD;  Location: [facility];  Service: Urology;  Laterality: Bilateral;   Family History  Problem Relation Age of Onset  . Heart failure Father   . Emphysema Mother   . Colon cancer Neg Hx    History  Substance Use Topics  . Smoking status: Never Smoker   . Smokeless tobacco: Never Used  . Alcohol Use: No    Review of Systems  Constitutional: Negative for fever.  Respiratory: Negative for cough and shortness of breath.   Gastrointestinal: Negative for vomiting and abdominal pain.  Genitourinary: Positive for frequency, hematuria and scrotal swelling (chronic, since his surgery 3 months ago). Negative for dysuria, discharge, penile swelling and penile pain.  All other systems reviewed and are negative.     Allergies  Other  Home Medications   Prior to Admission medications   Medication Sig Start Date End Date Taking? Authorizing Provider  acetaminophen (TYLENOL) 500 MG tablet Take 500 mg by mouth every 6 (six) hours as needed for moderate pain or headache.   Yes Historical Provider, MD  aspirin EC 81 MG tablet Take 81 mg by mouth daily.    Yes Historical Provider, MD  atenolol (TENORMIN) 25 MG tablet Take 25 mg by mouth  daily.  04/24/13  Yes Historical Provider, MD  Cholecalciferol (VITAMIN D PO) Take 1,000 Units by mouth daily.    Yes Historical Provider, MD  cyanocobalamin 500 MCG tablet Take 500 mcg by mouth daily.   Yes Historical Provider, MD  esomeprazole (NEXIUM) 40 MG packet Take 40 mg by mouth daily as needed (Acid reflux).    Yes Historical Provider, MD  ezetimibe (ZETIA) 10 MG tablet Take 10 mg by mouth daily.  01/24/13  Yes Historical Provider, MD  IRON PO Take 1 tablet by mouth daily.    Yes Historical Provider, MD  loratadine  (CLARITIN) 10 MG tablet Take 10 mg by mouth every other day.   Yes Historical Provider, MD  metFORMIN (GLUCOPHAGE) 500 MG tablet Take 1,000 mg by mouth daily with breakfast.  07/27/13  Yes Historical Provider, MD  Omega-3 Fatty Acids (FISH OIL) 1200 MG CAPS Take 1,200 mg by mouth once a week.    Yes Historical Provider, MD  Polyvinyl Alcohol-Povidone (REFRESH OP) Place 1 drop into both eyes daily as needed (dry eyes).   Yes Historical Provider, MD  solifenacin (VESICARE) 5 MG tablet Take 5 mg by mouth every evening.   Yes Historical Provider, MD  tamsulosin (FLOMAX) 0.4 MG CAPS capsule Take 0.4 mg by mouth 2 (two) times daily.  09/11/13  Yes Historical Provider, MD  valsartan (DIOVAN) 320 MG tablet Take 320 mg by mouth daily.  08/10/13  Yes Historical Provider, MD  vitamin C (ASCORBIC ACID) 500 MG tablet Take 500 mg by mouth 2 (two) times daily.    Yes Historical Provider, MD  oxyCODONE (OXY IR/ROXICODONE) 5 MG immediate release tablet Take 1-2 tablets (5-10 mg total) by mouth every 4 (four) hours as needed for moderate pain, severe pain or breakthrough pain. Patient not taking: Reported on 06/24/2014 03/23/14   Karie Soda, MD   BP 135/96 mmHg  Pulse 61  Temp(Src) 97.5 F (36.4 C) (Oral)  Resp 18  SpO2 100% Physical Exam  Constitutional: He is oriented to person, place, and time. He appears well-developed and well-nourished. No distress.  HENT:  Head: Normocephalic and atraumatic.  Mouth/Throat: No oropharyngeal exudate.  Eyes: EOM are normal. Pupils are equal, round, and reactive to light.  Neck: Normal range of motion. Neck supple.  Cardiovascular: Normal rate and regular rhythm.  Exam reveals no friction rub.   No murmur heard. Pulmonary/Chest: Effort normal and breath sounds normal. No respiratory distress. He has no wheezes. He has no rales.  Abdominal: Soft. He exhibits no distension. There is no tenderness. There is no rebound.  Suprapubic surgical wound, packed, no surrounding  cellulitis.  Genitourinary: Right testis shows no mass, no swelling and no tenderness. Right testis is descended. Cremasteric reflex is not absent on the right side. Left testis shows swelling. Left testis shows no mass and no tenderness. Left testis is descended. Cremasteric reflex is not absent on the left side.  Musculoskeletal: Normal range of motion. He exhibits no edema.  Neurological: He is alert and oriented to person, place, and time.  Skin: He is not diaphoretic.  Nursing note and vitals reviewed.   ED Course  Procedures (including critical care time) Labs Review Labs Reviewed  URINALYSIS, ROUTINE W REFLEX MICROSCOPIC - Abnormal; Notable for the following:    Color, Urine RED (*)    APPearance TURBID (*)    Hgb urine dipstick LARGE (*)    Protein, ur >300 (*)    All other components within normal limits  URINE CULTURE  URINE MICROSCOPIC-ADD ON    Imaging Review No results found.   EKG Interpretation None      MDM   Final diagnoses:  Hematuria    74 year old male here at hematuria. Began this morning. He saw his urologist, Dr. Hubert Azure, 2 days ago where he had a cystoscopy. He allegedly had cloudy urine at that time after clearing the urine he was found to have an irritated bladder. He reports he had a CT scan done in the office after that. He is hard of results of that. He is 3 months postop of a colovesicular fistula repair. He saw a surgeon within the last week and had the incision packed because of some wound dehiscence.  He denies any fever, vomiting, nausea, abdominal pain. The wound from his surgery is suprapubic, not draining, no surrounding cellulitis. He is nontender. He has some chronic left testicular swelling from that surgery due to a scrotal hematoma. Scrotal swelling I will speak with urology to see if I can get the CT report from 2 days ago. I'm not able to view it in the computer at this time.  I spoke with Dr. Lolly Glaus Field who said that CT in the office was  benign. This is likely post scope hematuria. He said to make sure he is not retaining urine and if he is not he can go home. Patient here with prior scan less than 100 mL of urine. He is actively urinating here. Patient stable for discharge, circumflex follows with urology.  Elwin Mocha, MD 06/24/14 [phone]

## 2014-06-24 NOTE — Discharge Instructions (Signed)

## 2014-06-24 NOTE — ED Notes (Signed)
Pt presents with c/o hematuria. Pt reports he has had urinary frequency for the past 3 weeks, has been to his doctor for same. Pt reports he passed something in his urine this morning, unsure of what he passed.

## 2014-06-25 LAB — URINE CULTURE
Colony Count: NO GROWTH
Culture: NO GROWTH
Special Requests: NORMAL

## 2014-07-17 ENCOUNTER — Other Ambulatory Visit: Payer: Self-pay | Admitting: Urology

## 2014-07-20 ENCOUNTER — Encounter (HOSPITAL_BASED_OUTPATIENT_CLINIC_OR_DEPARTMENT_OTHER): Payer: Self-pay | Admitting: *Deleted

## 2014-07-20 NOTE — Progress Notes (Signed)
   07/20/14 1244  OBSTRUCTIVE SLEEP APNEA  Have you ever been diagnosed with sleep apnea through a sleep study? No  Do you snore loudly (loud enough to be heard through closed doors)?  1  Do you often feel tired, fatigued, or sleepy during the daytime? 0  Has anyone observed you stop breathing during your sleep? 0  Do you have, or are you being treated for high blood pressure? 1  BMI more than 35 kg/m2? 0  Age over 74 years old? 1  Neck circumference greater than 40 cm/16 inches? 1  Gender: 1  Obstructive Sleep Apnea Score 5

## 2014-07-20 NOTE — Progress Notes (Signed)
NPO AFTER MN. ARRIVE AT 0600. NEEDS ISTAT.  CURRENT EKG IN CHART AND EPIC. WILL TAKE ATENOLOL, DIOVAN, NEXIUM , AND FLOMAX AM DOS W/ SIPS OF WATER.

## 2014-07-24 ENCOUNTER — Ambulatory Visit (HOSPITAL_BASED_OUTPATIENT_CLINIC_OR_DEPARTMENT_OTHER)
Admission: RE | Admit: 2014-07-24 | Discharge: 2014-07-24 | Disposition: A | Discharge status: Home / Self Care | Payer: Medicare Other | Source: Ambulatory Visit | Attending: Urology | Admitting: Urology

## 2014-07-24 ENCOUNTER — Encounter (HOSPITAL_BASED_OUTPATIENT_CLINIC_OR_DEPARTMENT_OTHER)
Admission: RE | Disposition: A | Discharge status: Home / Self Care | Payer: Self-pay | Source: Ambulatory Visit | Attending: Urology

## 2014-07-24 ENCOUNTER — Encounter (HOSPITAL_BASED_OUTPATIENT_CLINIC_OR_DEPARTMENT_OTHER): Payer: Self-pay | Admitting: *Deleted

## 2014-07-24 ENCOUNTER — Ambulatory Visit (HOSPITAL_BASED_OUTPATIENT_CLINIC_OR_DEPARTMENT_OTHER): Payer: Medicare Other | Admitting: Anesthesiology

## 2014-07-24 DIAGNOSIS — E780 Pure hypercholesterolemia: Secondary | ICD-10-CM | POA: Diagnosis not present

## 2014-07-24 DIAGNOSIS — I10 Essential (primary) hypertension: Secondary | ICD-10-CM | POA: Insufficient documentation

## 2014-07-24 DIAGNOSIS — Z947 Corneal transplant status: Secondary | ICD-10-CM | POA: Insufficient documentation

## 2014-07-24 DIAGNOSIS — G4730 Sleep apnea, unspecified: Secondary | ICD-10-CM | POA: Diagnosis not present

## 2014-07-24 DIAGNOSIS — E119 Type 2 diabetes mellitus without complications: Secondary | ICD-10-CM | POA: Insufficient documentation

## 2014-07-24 DIAGNOSIS — R310 Gross hematuria: Secondary | ICD-10-CM

## 2014-07-24 DIAGNOSIS — N3281 Overactive bladder: Secondary | ICD-10-CM | POA: Insufficient documentation

## 2014-07-24 DIAGNOSIS — N323 Diverticulum of bladder: Secondary | ICD-10-CM | POA: Insufficient documentation

## 2014-07-24 DIAGNOSIS — M109 Gout, unspecified: Secondary | ICD-10-CM | POA: Diagnosis not present

## 2014-07-24 DIAGNOSIS — Z7982 Long term (current) use of aspirin: Secondary | ICD-10-CM | POA: Diagnosis not present

## 2014-07-24 DIAGNOSIS — Z79899 Other long term (current) drug therapy: Secondary | ICD-10-CM | POA: Insufficient documentation

## 2014-07-24 DIAGNOSIS — K219 Gastro-esophageal reflux disease without esophagitis: Secondary | ICD-10-CM | POA: Insufficient documentation

## 2014-07-24 DIAGNOSIS — N1370 Vesicoureteral-reflux, unspecified: Secondary | ICD-10-CM | POA: Diagnosis not present

## 2014-07-24 DIAGNOSIS — N3021 Other chronic cystitis with hematuria: Secondary | ICD-10-CM | POA: Diagnosis not present

## 2014-07-24 HISTORY — PX: CYSTOSCOPY WITH BIOPSY: SHX5122

## 2014-07-24 HISTORY — DX: Pruritus, unspecified: L29.9

## 2014-07-24 HISTORY — DX: Other specified personal risk factors, not elsewhere classified: Z91.89

## 2014-07-24 HISTORY — DX: Dysuria: R30.0

## 2014-07-24 HISTORY — DX: Type 2 diabetes mellitus without complications: E11.9

## 2014-07-24 HISTORY — PX: CYSTOGRAM: SHX6285

## 2014-07-24 HISTORY — DX: Gross hematuria: R31.0

## 2014-07-24 HISTORY — DX: Personal history of other diseases of the musculoskeletal system and connective tissue: Z87.39

## 2014-07-24 HISTORY — PX: CYSTOSCOPY W/ RETROGRADES: SHX1426

## 2014-07-24 HISTORY — DX: Unspecified symptoms and signs involving the genitourinary system: R39.9

## 2014-07-24 HISTORY — DX: Vesicointestinal fistula: N32.1

## 2014-07-24 HISTORY — DX: Essential tremor: G25.0

## 2014-07-24 HISTORY — DX: Hyperlipidemia, unspecified: E78.5

## 2014-07-24 HISTORY — DX: Unspecified right bundle-branch block: I45.10

## 2014-07-24 HISTORY — DX: Personal history of other diseases of the digestive system: Z87.19

## 2014-07-24 HISTORY — DX: Diverticulosis of large intestine without perforation or abscess without bleeding: K57.30

## 2014-07-24 LAB — POCT I-STAT, CHEM 8
BUN: 38 mg/dL — ABNORMAL HIGH (ref 6–23)
CALCIUM ION: 1.36 mmol/L — AB (ref 1.13–1.30)
Chloride: 111 mmol/L (ref 96–112)
Creatinine, Ser: 1.7 mg/dL — ABNORMAL HIGH (ref 0.50–1.35)
Glucose, Bld: 116 mg/dL — ABNORMAL HIGH (ref 70–99)
HCT: 42 % (ref 39.0–52.0)
HEMOGLOBIN: 14.3 g/dL (ref 13.0–17.0)
POTASSIUM: 4.7 mmol/L (ref 3.5–5.1)
Sodium: 142 mmol/L (ref 135–145)
TCO2: 16 mmol/L (ref 0–100)

## 2014-07-24 SURGERY — CYSTOSCOPY, WITH BIOPSY
Anesthesia: General | Site: Ureter

## 2014-07-24 MED ORDER — METHYLPREDNISOLONE 4 MG PO TBPK: ORAL_TABLET | ORAL | Status: DC

## 2014-07-24 MED ORDER — PROPOFOL 10 MG/ML IV BOLUS
INTRAVENOUS | Status: DC | PRN
  Administered 2014-07-24: 140 mg via INTRAVENOUS
  Administered 2014-07-24: 60 mg via INTRAVENOUS

## 2014-07-24 MED ORDER — MIDAZOLAM HCL 5 MG/5ML IJ SOLN
INTRAMUSCULAR | Status: DC | PRN
  Administered 2014-07-24: 2 mg via INTRAVENOUS

## 2014-07-24 MED ORDER — ONDANSETRON HCL 4 MG/2ML IJ SOLN
INTRAMUSCULAR | Status: DC | PRN
  Administered 2014-07-24: 4 mg via INTRAVENOUS

## 2014-07-24 MED ORDER — CEFTRIAXONE SODIUM IN DEXTROSE 20 MG/ML IV SOLN
1.0000 g | INTRAVENOUS | Status: AC
  Administered 2014-07-24: 1 g via INTRAVENOUS
  Filled 2014-07-24: qty 50

## 2014-07-24 MED ORDER — LACTATED RINGERS IV SOLN
INTRAVENOUS | Status: DC
  Administered 2014-07-24: 07:00:00 via INTRAVENOUS
  Filled 2014-07-24: qty 1000

## 2014-07-24 MED ORDER — STERILE WATER FOR IRRIGATION IR SOLN
Status: DC | PRN
  Administered 2014-07-24: 3000 mL

## 2014-07-24 MED ORDER — LACTATED RINGERS IV SOLN
INTRAVENOUS | Status: DC
  Filled 2014-07-24: qty 1000

## 2014-07-24 MED ORDER — MIDAZOLAM HCL 2 MG/2ML IJ SOLN
INTRAMUSCULAR | Status: AC
  Filled 2014-07-24: qty 2

## 2014-07-24 MED ORDER — DIATRIZOATE MEGLUMINE 30 % UR SOLN
URETHRAL | Status: DC | PRN
  Administered 2014-07-24: 300 mL via URETHRAL

## 2014-07-24 MED ORDER — CEFTRIAXONE SODIUM 1 G IJ SOLR
INTRAMUSCULAR | Status: AC
  Filled 2014-07-24: qty 10

## 2014-07-24 MED ORDER — ACETAMINOPHEN 10 MG/ML IV SOLN
INTRAVENOUS | Status: DC | PRN
  Administered 2014-07-24: 1000 mg via INTRAVENOUS

## 2014-07-24 MED ORDER — EPHEDRINE SULFATE 50 MG/ML IJ SOLN
INTRAMUSCULAR | Status: DC | PRN
  Administered 2014-07-24: 12.5 mg via INTRAVENOUS
  Administered 2014-07-24: 10 mg via INTRAVENOUS
  Administered 2014-07-24: 15 mg via INTRAVENOUS

## 2014-07-24 MED ORDER — SODIUM CHLORIDE 0.9 % IR SOLN
Status: DC | PRN
  Administered 2014-07-24: 500 mL via INTRAVESICAL

## 2014-07-24 MED ORDER — DEXAMETHASONE SODIUM PHOSPHATE 4 MG/ML IJ SOLN
INTRAMUSCULAR | Status: DC | PRN
  Administered 2014-07-24: 10 mg via INTRAVENOUS

## 2014-07-24 MED ORDER — LIDOCAINE HCL (CARDIAC) 20 MG/ML IV SOLN
INTRAVENOUS | Status: DC | PRN
  Administered 2014-07-24: 60 mg via INTRAVENOUS

## 2014-07-24 MED ORDER — FENTANYL CITRATE (PF) 100 MCG/2ML IJ SOLN
INTRAMUSCULAR | Status: AC
  Filled 2014-07-24: qty 4

## 2014-07-24 MED ORDER — FENTANYL CITRATE (PF) 100 MCG/2ML IJ SOLN
INTRAMUSCULAR | Status: DC | PRN
  Administered 2014-07-24: 100 ug via INTRAVENOUS

## 2014-07-24 SURGICAL SUPPLY — 38 items
ADAPTER CATH URET PLST 4-6FR (CATHETERS) IMPLANT
BAG DRAIN URO-CYSTO SKYTR STRL (DRAIN) ×4 IMPLANT
BENZOIN TINCTURE PRP APPL 2/3 (GAUZE/BANDAGES/DRESSINGS) IMPLANT
CANISTER SUCT LVC 12 LTR MEDI- (MISCELLANEOUS) ×4 IMPLANT
CATH INTERMIT  6FR 70CM (CATHETERS) IMPLANT
CATH ROBINSON RED A/P 14FR (CATHETERS) IMPLANT
CATH ROBINSON RED A/P 16FR (CATHETERS) IMPLANT
CATH URET 5FR 28IN CONE TIP (BALLOONS)
CATH URET 5FR 28IN OPEN ENDED (CATHETERS) ×4 IMPLANT
CATH URET 5FR 70CM CONE TIP (BALLOONS) IMPLANT
CLOTH BEACON ORANGE TIMEOUT ST (SAFETY) ×4 IMPLANT
DRSG TEGADERM 2-3/8X2-3/4 SM (GAUZE/BANDAGES/DRESSINGS) IMPLANT
ELECT REM PT RETURN 9FT ADLT (ELECTROSURGICAL) ×4
ELECTRODE REM PT RTRN 9FT ADLT (ELECTROSURGICAL) ×2 IMPLANT
GLOVE BIO SURGEON STRL SZ 6.5 (GLOVE) ×3 IMPLANT
GLOVE BIO SURGEON STRL SZ7.5 (GLOVE) ×4 IMPLANT
GLOVE BIO SURGEONS STRL SZ 6.5 (GLOVE) ×1
GLOVE INDICATOR 6.5 STRL GRN (GLOVE) ×8 IMPLANT
GOWN STRL REUS W/ TWL LRG LVL3 (GOWN DISPOSABLE) ×2 IMPLANT
GOWN STRL REUS W/ TWL XL LVL3 (GOWN DISPOSABLE) ×2 IMPLANT
GOWN STRL REUS W/TWL LRG LVL3 (GOWN DISPOSABLE) ×2
GOWN STRL REUS W/TWL XL LVL3 (GOWN DISPOSABLE) ×2
GUIDEWIRE 0.038 PTFE COATED (WIRE) IMPLANT
GUIDEWIRE ANG ZIPWIRE 038X150 (WIRE) ×4 IMPLANT
GUIDEWIRE STR DUAL SENSOR (WIRE) ×4 IMPLANT
KIT BALLIN UROMAX 15FX10 (LABEL) IMPLANT
KIT BALLN UROMAX 15FX4 (MISCELLANEOUS) IMPLANT
KIT BALLN UROMAX 26 75X4 (MISCELLANEOUS)
NDL SAFETY ECLIPSE 18X1.5 (NEEDLE) IMPLANT
NEEDLE HYPO 18GX1.5 SHARP (NEEDLE)
NEEDLE HYPO 22GX1.5 SAFETY (NEEDLE) ×4 IMPLANT
NS IRRIG 500ML POUR BTL (IV SOLUTION) ×4 IMPLANT
PACK CYSTO (CUSTOM PROCEDURE TRAY) ×4 IMPLANT
SET HIGH PRES BAL DIL (LABEL)
SYR 20CC LL (SYRINGE) IMPLANT
SYR BULB IRRIGATION 50ML (SYRINGE) IMPLANT
SYRINGE 10CC LL (SYRINGE) ×4 IMPLANT
WATER STERILE IRR 3000ML UROMA (IV SOLUTION) ×4 IMPLANT

## 2014-07-24 NOTE — Discharge Instructions (Signed)
Post Anesthesia Home Care Instructions  Activity: Get plenty of rest for the remainder of the day. A responsible adult should stay with you for 24 hours following the procedure.  For the next 24 hours, DO NOT: -Drive a car -Advertising copywriterperate machinery -Drink alcoholic beverages -Take any medication unless instructed by your physician -Make any legal decisions or sign important papers.  Meals: Start with liquid foods such as gelatin or soup. Progress to regular foods as tolerated. Avoid greasy, spicy, heavy foods. If nausea and/or vomiting occur, drink only clear liquids until the nausea and/or vomiting subsides. Call your physician if vomiting continues.  Special Instructions/Symptoms: Your throat may feel dry or sore from the anesthesia or the breathing tube placed in your throat during surgery. If this causes discomfort, gargle with warm salt water. The discomfort should disappear within 24 hours.  If you had a scopolamine patch placed behind your ear for the management of post- operative nausea and/or vomiting:  1. The medication in the patch is effective for 72 hours, after which it should be removed.  Wrap patch in a tissue and discard in the trash. Wash hands thoroughly with soap and water. 2. You may remove the patch earlier than 72 hours if you experience unpleasant side effects which may include dry mouth, dizziness or visual disturbances. 3. Avoid touching the patch. Wash your hands with soap and water after contact with the patch.    CYSTOSCOPY HOME CARE INSTRUCTIONS  Activity: Rest for the remainder of the day.  Do not drive or operate equipment today.  You may resume normal activities in one to two days as instructed by your physician.   Meals: Drink plenty of liquids and eat light foods such as gelatin or soup this evening.  You may return to a normal meal plan tomorrow.  Return to Work: You may return to work in one to two days or as instructed by your physician.  Special  Instructions / Symptoms: Call your physician if any of these symptoms occur:   -persistent or heavy bleeding  -bleeding which continues after first few urination  -large blood clots that are difficult to pass  -urine stream diminishes or stops completely  -fever equal to or higher than 101 degrees Farenheit.  -cloudy urine with a strong, foul odor  -severe pain  Females should always wipe from front to back after elimination.  You may feel some burning pain when you urinate.  This should disappear with time.  Applying moist heat to the lower abdomen or a hot tub bath may help relieve the pain. \  Follow-Up / Date of Return Visit to Your Physician:   Call for an appointment to arrange follow-up.  Patient Signature:  ________________________________________________________  Nurse's Signature:  ________________________________________________________ Cystoscopy patient instructions  Following a cystoscopy, a catheter (a flexible rubber tube) is sometimes left in place to empty the bladder. This may cause some discomfort or a feeling that you need to urinate. Your doctor determines the period of time that the catheter will be left in place. You may have bloody urine for two to three days (Call your doctor if the amount of bleeding increases or does not subside).  You may pass blood clots in your urine, especially if you had a biopsy. It is not unusual to pass small blood clots and have some bloody urine a couple of weeks after your cystoscopy. Again, call your doctor if the bleeding does not subside. You may have: Dysuria (painful urination) Frequency (urinating often) Urgency (  strong desire to urinate)  These symptoms are common especially if medicine is instilled into the bladder or a ureteral stent is placed. Avoiding alcohol and caffeine, such as coffee, tea, and chocolate, may help relieve these symptoms. Drink plenty of water, unless otherwise instructed. Your doctor may also  prescribe an antibiotic or other medicine to reduce these symptoms.  Cystoscopy results are available soon after the procedure; biopsy results usually take two to four days. Your doctor will discuss the results of your exam with you. Before you go home, you will be given specific instructions for follow-up care. Special Instructions:  1 If you are going home with a catheter in place do not take a tub bath until removed by your doctor.  2 You may resume your normal activities.  3 Do not drive or operate machinery if you are taking narcotic pain medicine.  4 Be sure to keep all follow-up appointments with your doctor.   5 Call Your Doctor If: The catheter is not draining  You have severe pain  You are unable to urinate  You have a fever over 101  You have severe bleeding

## 2014-07-24 NOTE — H&P (Signed)
Reason For Visit   Scott Nixon presents again for followup. We moved up his most recent followup because of ongoing severe voiding symptoms and now more recently, some intermittent gross hematuria. Again, the section below will outline his history. He underwent a colovesical fistula repair by Dr Michaell Cowing. We were not there at the time of surgery, except to put some temporary stents in the kidney for identification of the ureter, which were then subsequently removed. Since surgery, he did develop a hematoma of his left spermatic cord. His biggest problem, however, has been some severe bladder overactivity. He has had 3 or 4 urinalyses in our office, which have been markedly abnormal with too numerous to count white cells and also some bacteria. We, however, did 2 urine cultures in February, both which showed insignificant growth and another culture in March of this year which was no growth. This was despite urinalyses that was markedly abnormal. He has had empiric antibiotics with the idea that maybe he has an unusual organism not growing on culture. He just finished a course of ciprofloxacin x10 days that was prescribed by Dr Michaell Cowing. This has resulted in no improvement in his voiding, and if anything, his situation has worsened with some recent intermittent gross hematuria. His biggest complaints have been ongoing severe urinary frequency, urgency. His urine has been cloudy and he has been voiding every 1 hour night and day. His urinalysis today shows a large amount of blood on dipstick. He has too numerous to count white blood cells with at least moderate red blood cells per high power field and moderate bacteria. Again, this is fairly consistent with what we have seen on numerous occasions over the last several months but, again, his culture continues to be negative, which his surprising.      History of Present Illness   Past urologic history  Scott Nixon is 74 years of age. I saw him several times in the fall  of 2014 with issues with chronic cystitis/chronic pyuria. We eventually diagnosed him with a colovesical fistula and referred him to General Surgery. On Christmas eve he underwent a laparoscopic repair of colovesical fistula. We placed double-J stents for General Surgery at that time to aid in ureteral identification. He was in the hospital for several days, but his Foley catheter was removed prior to discharge. He has been able to void well, but has noticed recently some recurrent cloudy urine.     In addition, every since his surgery he has had significant left-sided scrotal swelling. It is not necessarily increased in size. He has had some ongoing discomfort which may be improving slightly. He has had no definitive fever or chills. He was in to see Dr Michaell Cowing recently and was told that if his scrotal swelling did not improve, that he should come in to see me today.    Past Medical History Problems  1. History of diabetes mellitus (Z86.39) 2. History of esophageal reflux (Z87.19) 3. History of gout (Z87.39) 4. History of hypercholesterolemia (Z86.39) 5. History of hypertension (Z86.79) 6. History of sleep apnea (Z87.09)  Surgical History Problems  1. History of Cornea Transplant 2. History of Eye Surgery 3. History of Hernia Repair 4. History of Shoulder Surgery  Current Meds 1. Aspirin 81 MG Oral Tablet;  Therapy: (Recorded:29Sep2015) to Recorded 2. Atenolol TABS;  Therapy: (Recorded:29Sep2015) to Recorded 3. Fish Oil CAPS;  Therapy: (Recorded:29Sep2015) to Recorded 4. MetFORMIN HCl - 500 MG Oral Tablet;  Therapy: (Recorded:29Sep2015) to Recorded 5. Tamsulosin HCl - 0.4  MG Oral Capsule; TAKE 2 CAPSULES BY MOUTH EVERY DAY   Requested for: 15Mar2016; Last Rx:15Mar2016 Ordered 6. Valsartan-Hydrochlorothiazide TABS;  Therapy: (Recorded:29Sep2015) to Recorded 7. VESIcare 5 MG Oral Tablet; Take 1 tablet daily;  Therapy: 15Mar2016 to (Evaluate:11Sep2016)  Requested for: 15Mar2016;  Last  Rx:15Mar2016 Ordered 8. Zetia TABS;  Therapy: (Recorded:29Sep2015) to Recorded  Allergies Medication  1. No Known Drug Allergies  Family History Problems  1. Family history of heart failure (Z82.49) : Father 2. Family history of prostate cancer (Z80.42) : Grandfather  Social History Problems  1. Denied: History of Alcohol use 2. Caffeine use (F15.90)   2 3. Never a smoker  Review of Systems Genitourinary, constitutional, skin, eye, otolaryngeal, hematologic/lymphatic, cardiovascular, pulmonary, endocrine, musculoskeletal, gastrointestinal, neurological and psychiatric system(s) were reviewed and pertinent findings if present are noted and are otherwise negative.  Genitourinary: urinary frequency, feelings of urinary urgency, nocturia, hematuria, cloudy urine, erectile dysfunction, testicular mass, scrotal swelling and scrotal mass, but no dysuria.  Gastrointestinal: abdominal pain and diarrhea.  Constitutional: feeling tired (fatigue).  Integumentary: pruritus.  Eyes: blurred vision.    Vitals Vital Signs [Data Includes: Last 1 Day]  Recorded: 15Apr2016 03:00PM  Blood Pressure: 119 / 60 Temperature: 61 F Heart Rate: 97  Well developed WN male in NAd Resp: Nl effort Cards: RRR Abd: Soft/ no obviuous mass GU: wnl Ext: no edema  Results/Data Urine [Data Includes: Last 1 Day]   15Apr2016 COLOR AMBER  APPEARANCE TURBID  SPECIFIC GRAVITY 1.025  pH 5.0  GLUCOSE NEG mg/dL BILIRUBIN NEG  KETONE NEG mg/dL BLOOD LARGE  PROTEIN 30 mg/dL UROBILINOGEN 0.2 mg/dL NITRITE NEG  LEUKOCYTE ESTERASE LARGE  SQUAMOUS EPITHELIAL/HPF NONE SEEN  WBC TNTC WBC/hpf RBC 21-50 RBC/hpf BACTERIA MODERATE  CRYSTALS NONE SEEN  CASTS NONE SEEN   Assessment Assessed  1. Chronic cystitis (N30.20) 2. Scrotal hematoma (N50.1) 3. Gross hematuria (R31.0)  Plan Health Maintenance  1. UA With REFLEX; [Do Not Release]; Status:Complete;   Done: 15Apr2016  02:36PM  Discussion/Summary   Scott Nixon has really had a very difficult time status post his repair of his colovesical fistula. Subjectively, he has complained of significant problems with bladder overactivity, fairly severe frequency, nocturia, and urgency. His urine has continually been cloudy. Objectively, he has had considerable abnormality on his urinalysis, but has had multiple negative urine cultures. I did perform cystoscopy in the office and saw objective evidence of significant inflammation, primarily centered on the base of his bladder. He has had no evidence, however, of a recurrent or persistent fistula since we would fully expect him to have positive urine cultures if there was a true communication to his bowel and/or pneumaturia and none of those problems have been found. I do not have a good explanation for his severe, ongoing bladder inflammation. I do not think that just continued ongoing use of antibiotics is likely to be in his best interest. I will reculture his urine today and certainly if we do find a fungal infection or bacterial organism, we will treat it as appropriate. I do think that it would be prudent at this point to go ahead with repeat cystoscopy under anesthesia. I would like to really be able to look at his bladder with both a forward and right-angle lens system. We need to be absolutely certain we cannot find an obvious cause for his problems. I will plan on a cystogram under anesthesia, as well as a cystoscopy. We will plan on retrograde pyelography as well. I will also plan on doing  biopsies of the area of inflammation as indicated. If I can find no cause for his problems, then he may indeed want to pursue a second opinion at either [facility] or [facility].  cc: Karie Soda, MD  Merlene Laughter, MD   Signatures Electronically signed by : Barron Alvine, M.D.; Jul 17 2014  1:29PM EST

## 2014-07-24 NOTE — Transfer of Care (Signed)
Immediate Anesthesia Transfer of Care Note  Patient: Scott Nixon  Procedure(s) Performed: Procedure(s): CYSTOSCOPY WITH BIOPSY (N/A) CYSTOSCOPY WITH RETROGRADE PYELOGRAM (Bilateral) CYSTOGRAM (N/A)  Patient Location: PACU  Anesthesia Type:General  Level of Consciousness: awake, alert , oriented and patient cooperative  Airway & Oxygen Therapy: Patient Spontanous Breathing and Patient connected to nasal cannula oxygen  Post-op Assessment: Report given to RN and Post -op Vital signs reviewed and stable  Post vital signs: Reviewed and stable  Last Vitals:  Filed Vitals:   07/24/14 0610  BP: 127/59  Pulse: 60  Temp: 36.4 C  Resp: 16    Complications: No apparent anesthesia complications

## 2014-07-24 NOTE — Anesthesia Postprocedure Evaluation (Signed)
  Anesthesia Post-op Note  Patient: Scott Nixon  Procedure(s) Performed: Procedure(s): CYSTOSCOPY WITH BIOPSY (N/A) CYSTOSCOPY WITH RETROGRADE PYELOGRAM (Bilateral) CYSTOGRAM (N/A)  Patient Location: PACU  Anesthesia Type:General  Level of Consciousness: awake and alert   Airway and Oxygen Therapy: Patient Spontanous Breathing  Post-op Pain: none  Post-op Assessment: Post-op Vital signs reviewed, Patient's Cardiovascular Status Stable and Respiratory Function Stable  Post-op Vital Signs: Reviewed  Filed Vitals:   07/24/14 0900  BP: 118/63  Pulse: 64  Temp:   Resp: 18    Complications: No apparent anesthesia complications

## 2014-07-24 NOTE — Interval H&P Note (Signed)
History and Physical Interval Note:  07/24/2014 7:36 AM  Scott Nixon  has presented today for surgery, with the diagnosis of GROSS HEMATURIA, CHRONIC CYSTITIS  The various methods of treatment have been discussed with the patient and family. After consideration of risks, benefits and other options for treatment, the patient has consented to  Procedure(s): CYSTOSCOPY WITH BIOPSY (N/A) CYSTOSCOPY WITH RETROGRADE PYELOGRAM (Bilateral) CYSTOGRAM (N/A) as a surgical intervention .  The patient's history has been reviewed, patient examined, no change in status, stable for surgery.  I have reviewed the patient's chart and labs.  Questions were answered to the patient's satisfaction.     Zahraa Bhargava S

## 2014-07-24 NOTE — Anesthesia Procedure Notes (Signed)
Procedure Name: LMA Insertion Date/Time: 07/24/2014 7:43 AM Performed by: Tyrone NineSAUVE, Ophia Shamoon F Pre-anesthesia Checklist: Patient identified, Timeout performed, Emergency Drugs available, Suction available and Patient being monitored Patient Re-evaluated:Patient Re-evaluated prior to inductionOxygen Delivery Method: Circle system utilized Preoxygenation: Pre-oxygenation with 100% oxygen Intubation Type: IV induction Ventilation: Mask ventilation without difficulty LMA: LMA inserted LMA Size: 4.0 Number of attempts: 1 Airway Equipment and Method: Bite block Placement Confirmation: positive ETCO2 and breath sounds checked- equal and bilateral Tube secured with: Tape Dental Injury: Teeth and Oropharynx as per pre-operative assessment

## 2014-07-24 NOTE — Anesthesia Preprocedure Evaluation (Addendum)
Anesthesia Evaluation  Patient identified by MRN, date of birth, ID band Patient awake    Reviewed: Allergy & Precautions, H&P , NPO status , Patient's Chart, lab work & pertinent test results, reviewed documented beta blocker date and time   Airway Mallampati: II  TM Distance: >3 FB Neck ROM: Full    Dental no notable dental hx. (+) Teeth Intact, Dental Advisory Given, Implants   Pulmonary neg pulmonary ROS,  breath sounds clear to auscultation  Pulmonary exam normal       Cardiovascular hypertension, Pt. on medications and Pt. on home beta blockers + dysrhythmias Rhythm:Regular Rate:Normal     Neuro/Psych  Headaches, negative psych ROS   GI/Hepatic Neg liver ROS, GERD-  Medicated and Controlled,  Endo/Other  negative endocrine ROSdiabetes, Type 2, Oral Hypoglycemic Agents  Renal/GU Renal disease  negative genitourinary   Musculoskeletal   Abdominal   Peds  Hematology negative hematology ROS (+) anemia ,   Anesthesia Other Findings   Reproductive/Obstetrics negative OB ROS                          Anesthesia Physical Anesthesia Plan  ASA: III  Anesthesia Plan: General   Post-op Pain Management:    Induction: Intravenous  Airway Management Planned: LMA  Additional Equipment:   Intra-op Plan:   Post-operative Plan: Extubation in OR  Informed Consent: I have reviewed the patients History and Physical, chart, labs and discussed the procedure including the risks, benefits and alternatives for the proposed anesthesia with the patient or authorized representative who has indicated his/her understanding and acceptance.   Dental advisory given  Plan Discussed with: CRNA  Anesthesia Plan Comments:         Anesthesia Quick Evaluation

## 2014-07-24 NOTE — Op Note (Signed)
Preoperative diagnosis: Gross hematuria and chronic cystitis Postoperative diagnosis: Same, bilateral vesicoureteral reflux  Procedure: Cystoscopy, cystogram with fluoroscopic interpretation, bladder biopsy with fulguration and bladder barbotage   Surgeon: Valetta Fulleravid S. Lash Matulich M.D.  Anesthesia: Gen.  Indications: [name] is 74 years of age. 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 Brett Nixon gross approximately 2-3 months ago. We had placed ureteral stents preoperatively for identification of the ureters were otherwise were not involved in his care. Postoperatively he did develop a fairly large left spermatic cord hematoma. His biggest complaint however is been ongoing severe urinary frequency urgency and nocturia. He has persisted in having a markedly abnormal urinalysis but is had multiple cultures that were negative for definitive bacterial growth. He has had no ongoing pneumaturia. There is been no evidence of any obvious recurrent or persistent fistula. The patient underwent office cystoscopy which revealed what appeared to be significant inflammatory changes but a definitive diagnosis could not be established. The patient subsequently had repeat CT imaging of the abdomen and pelvis. This showed postoperative changes consistent with partial colectomy. The bladder wall showed some thickening and enhancement compatible with inflammatory picture. There was no air within the bladder or within the bladder wall. The patient was noted to have what was felt to be some right-sided pelvocaliectasis. He presents now for more definitive evaluation of his bladder.     Technique and findings: Patient was brought the operating room where he had successful induction of general anesthesia. He was placed in lithotomy position prepped and draped in usual manner. Appropriate surgical timeout was performed. Cystoscopy revealed mild trilobar hyperplasia. The bladder itself was diffusely  erythematous. The picture was consistent with a diffuse inflammatory picture. The patient did have a number of small bladder diverticuli and cellules along with some generalized trabeculation. These findings were consistent with outlet obstruction. I did not see any isolated areas of increased erythema or edema consistent with tumor or recurrent fistula. The entire bladder however did show a diffuse inflammatory appearance. The mucosa itself appeared to be very friable. The findings were similar to what would be seen with chronic interstitial or radiation cystitis. Bladder capacity was markedly reduced at about 250 mL.   Cystogram was performed. Again only about 250 mL of contrast was able to be instilled under gravity. Interestingly the patient was noted to have bilateral vesicoureteral reflux. There was considerable J hooking of his distal ureters and some tortuosity of the ureters bilaterally. A cold cup biopsy was done of the posterior wall the bladder near areas of maximum inflammation. Bugbee electrode was used to fulgurate the areas. Patient also underwent barbotage of the bladder with saline for cytology. The patient was brought to PACU in stable condition having had no obvious complications or problems.

## 2014-07-25 ENCOUNTER — Encounter (HOSPITAL_BASED_OUTPATIENT_CLINIC_OR_DEPARTMENT_OTHER): Payer: Self-pay | Admitting: Urology

## 2014-08-03 ENCOUNTER — Emergency Department (HOSPITAL_COMMUNITY)
Admission: EM | Admit: 2014-08-03 | Discharge: 2014-08-03 | Disposition: A | Discharge status: Home / Self Care | Payer: Medicare Other | Source: Home / Self Care | Attending: Emergency Medicine | Admitting: Emergency Medicine

## 2014-08-03 ENCOUNTER — Encounter (HOSPITAL_COMMUNITY): Payer: Self-pay | Admitting: Emergency Medicine

## 2014-08-03 DIAGNOSIS — R34 Anuria and oliguria: Secondary | ICD-10-CM

## 2014-08-03 DIAGNOSIS — R339 Retention of urine, unspecified: Secondary | ICD-10-CM

## 2014-08-03 DIAGNOSIS — Z79899 Other long term (current) drug therapy: Secondary | ICD-10-CM

## 2014-08-03 DIAGNOSIS — Z87448 Personal history of other diseases of urinary system: Secondary | ICD-10-CM | POA: Insufficient documentation

## 2014-08-03 DIAGNOSIS — N3021 Other chronic cystitis with hematuria: Secondary | ICD-10-CM | POA: Diagnosis not present

## 2014-08-03 DIAGNOSIS — T83098A Other mechanical complication of other indwelling urethral catheter, initial encounter: Secondary | ICD-10-CM | POA: Diagnosis present

## 2014-08-03 DIAGNOSIS — K219 Gastro-esophageal reflux disease without esophagitis: Secondary | ICD-10-CM | POA: Diagnosis present

## 2014-08-03 DIAGNOSIS — E119 Type 2 diabetes mellitus without complications: Secondary | ICD-10-CM | POA: Insufficient documentation

## 2014-08-03 DIAGNOSIS — Z87442 Personal history of urinary calculi: Secondary | ICD-10-CM

## 2014-08-03 DIAGNOSIS — D62 Acute posthemorrhagic anemia: Secondary | ICD-10-CM | POA: Diagnosis present

## 2014-08-03 DIAGNOSIS — E871 Hypo-osmolality and hyponatremia: Secondary | ICD-10-CM | POA: Diagnosis present

## 2014-08-03 DIAGNOSIS — Z872 Personal history of diseases of the skin and subcutaneous tissue: Secondary | ICD-10-CM

## 2014-08-03 DIAGNOSIS — Z7982 Long term (current) use of aspirin: Secondary | ICD-10-CM

## 2014-08-03 DIAGNOSIS — G250 Essential tremor: Secondary | ICD-10-CM | POA: Diagnosis present

## 2014-08-03 DIAGNOSIS — I10 Essential (primary) hypertension: Secondary | ICD-10-CM

## 2014-08-03 DIAGNOSIS — Y846 Urinary catheterization as the cause of abnormal reaction of the patient, or of later complication, without mention of misadventure at the time of the procedure: Secondary | ICD-10-CM | POA: Diagnosis present

## 2014-08-03 DIAGNOSIS — N189 Chronic kidney disease, unspecified: Secondary | ICD-10-CM | POA: Diagnosis present

## 2014-08-03 DIAGNOSIS — Z8739 Personal history of other diseases of the musculoskeletal system and connective tissue: Secondary | ICD-10-CM | POA: Insufficient documentation

## 2014-08-03 DIAGNOSIS — I4510 Unspecified right bundle-branch block: Secondary | ICD-10-CM | POA: Diagnosis present

## 2014-08-03 DIAGNOSIS — T8389XA Other specified complication of genitourinary prosthetic devices, implants and grafts, initial encounter: Secondary | ICD-10-CM | POA: Diagnosis not present

## 2014-08-03 DIAGNOSIS — Z8249 Family history of ischemic heart disease and other diseases of the circulatory system: Secondary | ICD-10-CM

## 2014-08-03 DIAGNOSIS — N179 Acute kidney failure, unspecified: Secondary | ICD-10-CM | POA: Diagnosis present

## 2014-08-03 DIAGNOSIS — E875 Hyperkalemia: Secondary | ICD-10-CM | POA: Diagnosis present

## 2014-08-03 DIAGNOSIS — D649 Anemia, unspecified: Secondary | ICD-10-CM

## 2014-08-03 DIAGNOSIS — E785 Hyperlipidemia, unspecified: Secondary | ICD-10-CM | POA: Diagnosis present

## 2014-08-03 DIAGNOSIS — Z8669 Personal history of other diseases of the nervous system and sense organs: Secondary | ICD-10-CM | POA: Insufficient documentation

## 2014-08-03 DIAGNOSIS — Z889 Allergy status to unspecified drugs, medicaments and biological substances status: Secondary | ICD-10-CM

## 2014-08-03 DIAGNOSIS — N138 Other obstructive and reflux uropathy: Secondary | ICD-10-CM | POA: Diagnosis present

## 2014-08-03 DIAGNOSIS — Z7952 Long term (current) use of systemic steroids: Secondary | ICD-10-CM

## 2014-08-03 DIAGNOSIS — N1370 Vesicoureteral-reflux, unspecified: Secondary | ICD-10-CM | POA: Diagnosis present

## 2014-08-03 DIAGNOSIS — I129 Hypertensive chronic kidney disease with stage 1 through stage 4 chronic kidney disease, or unspecified chronic kidney disease: Secondary | ICD-10-CM | POA: Diagnosis present

## 2014-08-03 DIAGNOSIS — M109 Gout, unspecified: Secondary | ICD-10-CM | POA: Diagnosis present

## 2014-08-03 DIAGNOSIS — Z825 Family history of asthma and other chronic lower respiratory diseases: Secondary | ICD-10-CM

## 2014-08-03 DIAGNOSIS — Z79891 Long term (current) use of opiate analgesic: Secondary | ICD-10-CM

## 2014-08-03 MED ORDER — PHENAZOPYRIDINE HCL 100 MG PO TABS
100.0000 mg | ORAL_TABLET | Freq: Once | ORAL | Status: AC
  Administered 2014-08-03: 100 mg via ORAL
  Filled 2014-08-03: qty 1

## 2014-08-03 MED ORDER — PHENAZOPYRIDINE HCL 200 MG PO TABS: 200.0000 mg | ORAL_TABLET | Freq: Three times a day (TID) | ORAL | Status: DC | PRN

## 2014-08-03 NOTE — ED Provider Notes (Signed)
CSN: [id number]     Arrival date & time 08/03/14  2154 History   First MD Initiated Contact with Patient 08/03/14 2201     Chief Complaint  Patient presents with  . Urinary Retention      HPI  Patient states that his Foley catheter quit draining again this evening and complains of suprapubic pain.  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 Underwent resection in December. Follow-up cystoscopy last week showed marked bladder inflammation. He got urinary retention yesterday. Foley catheter placed that his urologist office. Had to have it irrigated this morning for repeat episode of retention. The day. Increasing blood in his urine tonight and developed retention from 7:00 on. Describes suprapubic pain, and urine flowing around the catheter through his urethra.  Past Medical History  Diagnosis Date  . GERD (gastroesophageal reflux disease)   . HTN (hypertension)   . Nephrolithiasis   . Chronic cystitis   . Headache   . Anemia   . Right bundle branch block (RBBB)   . Benign essential tremor   . Type 2 diabetes mellitus   . Hyperlipidemia   . Diverticulosis of colon   . History of diverticulitis of colon     adx 07-06-2009 w/ abscess  . Gross hematuria   . CVF (colovesical fistula)     repair 03-23-2014  . History of gout   . Lower urinary tract symptoms (LUTS)   . Dysuria   . Itching     back, arms,  hand,  feet--  chronic itching since surgery in Dec 2015  . At risk for sleep apnea     STOP-BANG= 5       SENT TO PCP 07-20-2014   Past Surgical History  Procedure Laterality Date  . Rotator cuff repair Left 2005  . Arm surgery Left 1988    torn ligament  . Colonoscopy N/A 03/22/2014    Procedure: COLONOSCOPY;  Surgeon: Louis Meckelobert D Kaplan, MD;  Location: [facility];  Service: Endoscopy;  Laterality: N/A;  . Appendectomy N/A 03/23/2014    Procedure: APPENDECTOMY;  Surgeon: Karie SodaSteven Gross, MD;  Location: [facility];  Service: General;  Laterality:  N/A;  . Proctoscopy N/A 03/23/2014    Procedure: RIGID PROCTOSCOPY;  Surgeon: Karie SodaSteven Gross, MD;  Location: [facility];  Service: General;  Laterality: N/A;  . Laparoscopic partial colectomy N/A 03/23/2014    Procedure: LAPAROSCOPIC LEFT  COLECTOMY, SPLENIC FLEXURE MOBILIZATION,  APPENDECTOMY, REPAIR BLADDER, COLOVESICAL FISTULA;  Surgeon: Karie SodaSteven Gross, MD;  Location: [facility];  Service: General;  Laterality: N/A;  . Cystoscopy with stent placement Bilateral 03/23/2014    Procedure: CYSTOSCOPY WITH BILATERAL URETERAL CATHETER  PLACEMENT;  Surgeon: Valetta Fulleravid S Grapey, MD;  Location: [facility];  Service: Urology;  Laterality: Bilateral;  . 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 Eye surgery Right 1963    eyelid  . Inguinal hernia repair Right 1950  . Cystoscopy with biopsy N/A 07/24/2014    Procedure: CYSTOSCOPY WITH BIOPSY;  Surgeon: Barron Alvineavid Grapey, MD;  [facility] [place];  Service: Urology;  Laterality: N/A;  . Cystoscopy w/ retrogrades Bilateral 07/24/2014    Procedure: CYSTOSCOPY WITH RETROGRADE PYELOGRAM;  Surgeon: Barron Alvineavid Grapey, MD;  [facility] [place];  Service: Urology;  Laterality: Bilateral;  . Cystogram N/A 07/24/2014    Procedure: CYSTOGRAM;  Surgeon: Barron Alvineavid Grapey, MD;  [facility] [place];  Service: Urology;  Laterality: N/A;   Family History  Problem Relation Age of Onset  .  Heart failure Father   . Emphysema Mother   . Colon cancer Neg Hx    History  Substance Use Topics  . Smoking status: Never Smoker   . Smokeless tobacco: Never Used  . Alcohol Use: No    Review of Systems  Constitutional: Negative for fever, chills, diaphoresis, appetite change and fatigue.  HENT: Negative for mouth sores, sore throat and trouble swallowing.   Eyes: Negative for visual disturbance.  Respiratory: Negative for cough, chest tightness, shortness of breath and wheezing.   Cardiovascular: Negative for chest pain.  Gastrointestinal: Negative for  nausea, vomiting, abdominal pain, diarrhea and abdominal distention.  Endocrine: Negative for polydipsia, polyphagia and polyuria.  Genitourinary: Positive for decreased urine volume. Negative for dysuria, frequency and hematuria.  Musculoskeletal: Negative for gait problem.  Skin: Negative for color change, pallor and rash.  Neurological: Negative for dizziness, syncope, light-headedness and headaches.  Hematological: Does not bruise/bleed easily.  Psychiatric/Behavioral: Negative for behavioral problems and confusion.      Allergies  Other  Home Medications   Prior to Admission medications   Medication Sig Start Date End Date Taking? Authorizing Provider  acetaminophen (TYLENOL) 500 MG tablet Take 500 mg by mouth every 6 (six) hours as needed for moderate pain or headache.    Historical Provider, MD  aspirin EC 81 MG tablet Take 81 mg by mouth daily.     Historical Provider, MD  atenolol (TENORMIN) 25 MG tablet Take 25 mg by mouth every morning.  04/24/13   Historical Provider, MD  Cholecalciferol (VITAMIN D PO) Take 1,000 Units by mouth daily.     Historical Provider, MD  cyanocobalamin 500 MCG tablet Take 500 mcg by mouth 2 (two) times a week.     Historical Provider, MD  esomeprazole (NEXIUM) 40 MG packet Take 40 mg by mouth daily as needed (Acid reflux).     Historical Provider, MD  ezetimibe (ZETIA) 10 MG tablet Take 10 mg by mouth daily.  01/24/13   Historical Provider, MD  IRON PO Take 1 tablet by mouth 2 (two) times a week.     Historical Provider, MD  loratadine (CLARITIN) 10 MG tablet Take 10 mg by mouth every other day.    Historical Provider, MD  metFORMIN (GLUCOPHAGE) 500 MG tablet Take 1,000 mg by mouth daily with breakfast.  07/27/13   Historical Provider, MD  methylPREDNISolone (MEDROL DOSEPAK) 4 MG TBPK tablet Take as directed- 6 pills day1 then decrease by 1 every day until gone 07/24/14   Barron Alvine, MD  Omega-3 Fatty Acids (FISH OIL) 1200 MG CAPS Take 1,200 mg by  mouth once a week.     Historical Provider, MD  phenazopyridine (PYRIDIUM) 200 MG tablet Take 1 tablet (200 mg total) by mouth 3 (three) times daily as needed (for sensation of urnary frequency with ca). 08/03/14   Rolland Porter, MD  Polyvinyl Alcohol-Povidone (REFRESH OP) Place 1 drop into both eyes daily as needed (dry eyes).    Historical Provider, MD  Psyllium (METAMUCIL PO) Take by mouth daily.    Historical Provider, MD  solifenacin (VESICARE) 5 MG tablet Take 5 mg by mouth every evening.    Historical Provider, MD  tamsulosin (FLOMAX) 0.4 MG CAPS capsule Take 0.4 mg by mouth 2 (two) times daily.  09/11/13   Historical Provider, MD  valsartan (DIOVAN) 40 MG tablet Take 40 mg by mouth every morning.    Historical Provider, MD  vitamin C (ASCORBIC ACID) 500 MG tablet Take 500 mg by  mouth 2 (two) times daily.     Historical Provider, MD   BP 119/55 mmHg  Pulse 93  Temp(Src) 97.9 F (36.6 C) (Oral)  Resp 20  SpO2 93% Physical Exam  Constitutional: He is oriented to person, place, and time. He appears well-developed and well-nourished. No distress.  HENT:  Head: Normocephalic.  Eyes: Conjunctivae are normal. Pupils are equal, round, and reactive to light. No scleral icterus.  Neck: Normal range of motion. Neck supple. No thyromegaly present.  Cardiovascular: Normal rate and regular rhythm.  Exam reveals no gallop and no friction rub.   No murmur heard. Pulmonary/Chest: Effort normal and breath sounds normal. No respiratory distress. He has no wheezes. He has no rales.  Abdominal: Soft. Bowel sounds are normal. He exhibits no distension. There is no tenderness. There is no rebound.    Musculoskeletal: Normal range of motion.  Neurological: He is alert and oriented to person, place, and time.  Skin: Skin is warm and dry. No rash noted.  Psychiatric: He has a normal mood and affect. His behavior is normal.    ED Course  Procedures (including critical care time) Labs Review Labs Reviewed -  No data to display  Imaging Review No results found.   EKG Interpretation None      MDM   Final diagnoses:  Urinary retention    Patient catheter irrigated until clear. He states he felt "300% better". I discussed with him using some Pyridium. He states that he was up most at night last night feeling like he needed to urinate despite the catheter. Given pyridium, and  discharged home.    Rolland PorterMark Erroll Wilbourne, MD 08/03/14 2308

## 2014-08-03 NOTE — Discharge Instructions (Signed)
Acute Urinary Retention  Acute urinary retention is when you are unable to pee (urinate). Acute urinary retention is common in older men. Prostates can get bigger, which blocks the flow of pee.   HOME CARE  · Drink enough fluids to keep your pee clear or pale yellow.  · If you are sent home with a tube that drains the bladder (catheter), there will be a drainage bag attached to it. There are two types of bags. One is big that you can wear at night without having to empty it. One is smaller and needs to be emptied more often.  ¨ Keep the drainage bag empty.  ¨ Keep the drainage bag lower than your catheter.  · Only take medicine as told by your doctor.  GET HELP IF:  · You have a low-grade fever.  · You have spasms or you are leaking pee when you have spasms.  GET HELP RIGHT AWAY IF:   · You have chills or a fever.  · Your catheter stops draining pee.  · Your catheter falls out.  · You have increased bleeding that does not stop after you have rested and increased the amount of fluids you had been drinking.  MAKE SURE YOU:   · Understand these instructions.  · Will watch your condition.  · Will get help right away if you are not doing well or get worse.  Document Released: 09/03/2007 Document Revised: 01/05/2013 Document Reviewed: 08/26/2012  [employer]® Patient Information ©2015 [employer]. This information is not intended to replace advice given to you by your health care provider. Make sure you discuss any questions you have with your health care provider.

## 2014-08-03 NOTE — ED Notes (Signed)
Pt's Foley catheter flushed with normal saline, now draining clear, pink-tinged urine.

## 2014-08-03 NOTE — ED Notes (Signed)
Pt's Foley catheter continues to drain clear, pink-tinged urine.

## 2014-08-03 NOTE — ED Notes (Signed)
Pt states he had a foley catheter placed yesterday and had to have it flushed out this morning and tonight it has quit draining again

## 2014-08-04 ENCOUNTER — Encounter (HOSPITAL_COMMUNITY): Payer: Self-pay

## 2014-08-04 ENCOUNTER — Encounter (HOSPITAL_COMMUNITY): Payer: Self-pay | Admitting: Emergency Medicine

## 2014-08-04 ENCOUNTER — Emergency Department (HOSPITAL_COMMUNITY)
Admission: EM | Admit: 2014-08-04 | Discharge: 2014-08-04 | Disposition: A | Discharge status: Home / Self Care | Payer: Medicare Other | Source: Home / Self Care | Attending: Emergency Medicine | Admitting: Emergency Medicine

## 2014-08-04 ENCOUNTER — Inpatient Hospital Stay (HOSPITAL_COMMUNITY)
Admission: EM | Admit: 2014-08-04 | Discharge: 2014-08-06 | DRG: 690 | Disposition: A | Discharge status: Home / Self Care | Payer: Medicare Other | Attending: Family Medicine | Admitting: Family Medicine

## 2014-08-04 DIAGNOSIS — N179 Acute kidney failure, unspecified: Secondary | ICD-10-CM

## 2014-08-04 DIAGNOSIS — I10 Essential (primary) hypertension: Secondary | ICD-10-CM | POA: Insufficient documentation

## 2014-08-04 DIAGNOSIS — E871 Hypo-osmolality and hyponatremia: Secondary | ICD-10-CM

## 2014-08-04 DIAGNOSIS — N139 Obstructive and reflux uropathy, unspecified: Secondary | ICD-10-CM | POA: Diagnosis not present

## 2014-08-04 DIAGNOSIS — N3289 Other specified disorders of bladder: Secondary | ICD-10-CM | POA: Insufficient documentation

## 2014-08-04 DIAGNOSIS — N3091 Cystitis, unspecified with hematuria: Secondary | ICD-10-CM

## 2014-08-04 DIAGNOSIS — D62 Acute posthemorrhagic anemia: Secondary | ICD-10-CM

## 2014-08-04 DIAGNOSIS — E875 Hyperkalemia: Secondary | ICD-10-CM | POA: Diagnosis not present

## 2014-08-04 DIAGNOSIS — D649 Anemia, unspecified: Secondary | ICD-10-CM | POA: Insufficient documentation

## 2014-08-04 DIAGNOSIS — Z8739 Personal history of other diseases of the musculoskeletal system and connective tissue: Secondary | ICD-10-CM | POA: Insufficient documentation

## 2014-08-04 DIAGNOSIS — K219 Gastro-esophageal reflux disease without esophagitis: Secondary | ICD-10-CM | POA: Insufficient documentation

## 2014-08-04 DIAGNOSIS — Z872 Personal history of diseases of the skin and subcutaneous tissue: Secondary | ICD-10-CM | POA: Insufficient documentation

## 2014-08-04 DIAGNOSIS — Z79899 Other long term (current) drug therapy: Secondary | ICD-10-CM | POA: Insufficient documentation

## 2014-08-04 DIAGNOSIS — Z7982 Long term (current) use of aspirin: Secondary | ICD-10-CM | POA: Insufficient documentation

## 2014-08-04 DIAGNOSIS — R310 Gross hematuria: Secondary | ICD-10-CM | POA: Diagnosis present

## 2014-08-04 DIAGNOSIS — T83091D Other mechanical complication of indwelling urethral catheter, subsequent encounter: Secondary | ICD-10-CM

## 2014-08-04 DIAGNOSIS — E119 Type 2 diabetes mellitus without complications: Secondary | ICD-10-CM | POA: Diagnosis not present

## 2014-08-04 LAB — BASIC METABOLIC PANEL
Anion gap: 6 (ref 5–15)
BUN: 55 mg/dL — AB (ref 6–20)
CHLORIDE: 102 mmol/L (ref 101–111)
CO2: 16 mmol/L — ABNORMAL LOW (ref 22–32)
CREATININE: 2.22 mg/dL — AB (ref 0.61–1.24)
Calcium: 8.2 mg/dL — ABNORMAL LOW (ref 8.9–10.3)
GFR calc non Af Amer: 27 mL/min — ABNORMAL LOW (ref >60)
GFR, EST AFRICAN AMERICAN: 32 mL/min — AB (ref >60)
Glucose, Bld: 151 mg/dL — ABNORMAL HIGH (ref 70–99)
Potassium: 5.2 mmol/L — ABNORMAL HIGH (ref 3.5–5.1)
Sodium: 124 mmol/L — ABNORMAL LOW (ref 135–145)

## 2014-08-04 LAB — CBC
HEMATOCRIT: 30.4 % — AB (ref 39.0–52.0)
Hemoglobin: 9.8 g/dL — ABNORMAL LOW (ref 13.0–17.0)
MCH: 27.3 pg (ref 26.0–34.0)
MCHC: 32.2 g/dL (ref 30.0–36.0)
MCV: 84.7 fL (ref 78.0–100.0)
Platelets: 197 10*3/uL (ref 150–400)
RBC: 3.59 MIL/uL — ABNORMAL LOW (ref 4.22–5.81)
RDW: 15.3 % (ref 11.5–15.5)
WBC: 9.2 10*3/uL (ref 4.0–10.5)

## 2014-08-04 LAB — CBC WITH DIFFERENTIAL/PLATELET
BASOS ABS: 0 10*3/uL (ref 0.0–0.1)
BASOS PCT: 0 % (ref 0–1)
EOS ABS: 0.1 10*3/uL (ref 0.0–0.7)
Eosinophils Relative: 1 % (ref 0–5)
HCT: 30.7 % — ABNORMAL LOW (ref 39.0–52.0)
HEMOGLOBIN: 9.9 g/dL — AB (ref 13.0–17.0)
Lymphocytes Relative: 7 % — ABNORMAL LOW (ref 12–46)
Lymphs Abs: 0.7 10*3/uL (ref 0.7–4.0)
MCH: 27 pg (ref 26.0–34.0)
MCHC: 32.2 g/dL (ref 30.0–36.0)
MCV: 83.9 fL (ref 78.0–100.0)
MONOS PCT: 7 % (ref 3–12)
Monocytes Absolute: 0.8 10*3/uL (ref 0.1–1.0)
NEUTROS PCT: 85 % — AB (ref 43–77)
Neutro Abs: 9.1 10*3/uL — ABNORMAL HIGH (ref 1.7–7.7)
PLATELETS: 204 10*3/uL (ref 150–400)
RBC: 3.66 MIL/uL — ABNORMAL LOW (ref 4.22–5.81)
RDW: 15.2 % (ref 11.5–15.5)
WBC: 10.8 10*3/uL — ABNORMAL HIGH (ref 4.0–10.5)

## 2014-08-04 LAB — URINALYSIS, ROUTINE W REFLEX MICROSCOPIC
Bilirubin Urine: NEGATIVE
Glucose, UA: NEGATIVE mg/dL
KETONES UR: NEGATIVE mg/dL
Nitrite: POSITIVE — AB
Protein, ur: >300 mg/dL — AB
Specific Gravity, Urine: 1.014 (ref 1.005–1.030)
UROBILINOGEN UA: 0.2 mg/dL (ref 0.0–1.0)
pH: 6 (ref 5.0–8.0)

## 2014-08-04 LAB — GLUCOSE, CAPILLARY
GLUCOSE-CAPILLARY: 109 mg/dL — AB (ref 70–99)
GLUCOSE-CAPILLARY: 161 mg/dL — AB (ref 70–99)

## 2014-08-04 LAB — URINE MICROSCOPIC-ADD ON

## 2014-08-04 MED ORDER — TAMSULOSIN HCL 0.4 MG PO CAPS
0.4000 mg | ORAL_CAPSULE | Freq: Two times a day (BID) | ORAL | Status: DC
  Administered 2014-08-04 – 2014-08-06 (×5): 0.4 mg via ORAL
  Filled 2014-08-04 (×6): qty 1

## 2014-08-04 MED ORDER — DIAZEPAM 5 MG/ML IJ SOLN
5.0000 mg | Freq: Once | INTRAMUSCULAR | Status: AC
Start: 2014-08-04 — End: 2014-08-04
  Administered 2014-08-04: 5 mg via INTRAVENOUS
  Filled 2014-08-04: qty 2

## 2014-08-04 MED ORDER — CARBOXYMETHYLCELLULOSE SODIUM 1 % OP SOLN: 1.0000 [drp] | OPHTHALMIC | Status: DC | PRN

## 2014-08-04 MED ORDER — SODIUM CHLORIDE 0.9 % IJ SOLN
3.0000 mL | INTRAMUSCULAR | Status: DC | PRN
  Administered 2014-08-06: 3 mL via INTRAVENOUS
  Filled 2014-08-04: qty 3

## 2014-08-04 MED ORDER — PANTOPRAZOLE SODIUM 40 MG PO PACK
80.0000 mg | PACK | Freq: Every day | ORAL | Status: DC | PRN
  Filled 2014-08-04: qty 40

## 2014-08-04 MED ORDER — LORATADINE 10 MG PO TABS
10.0000 mg | ORAL_TABLET | ORAL | Status: DC
  Administered 2014-08-04 – 2014-08-06 (×2): 10 mg via ORAL
  Filled 2014-08-04 (×2): qty 1

## 2014-08-04 MED ORDER — SODIUM CHLORIDE 0.9 % IV SOLN: 250.0000 mL | INTRAVENOUS | Status: DC | PRN

## 2014-08-04 MED ORDER — MORPHINE SULFATE 4 MG/ML IJ SOLN
4.0000 mg | Freq: Once | INTRAMUSCULAR | Status: AC
  Administered 2014-08-04: 4 mg via INTRAVENOUS
  Filled 2014-08-04: qty 1

## 2014-08-04 MED ORDER — INSULIN ASPART 100 UNIT/ML ~~LOC~~ SOLN
0.0000 [IU] | Freq: Four times a day (QID) | SUBCUTANEOUS | Status: DC
  Administered 2014-08-04 – 2014-08-05 (×2): 2 [IU] via SUBCUTANEOUS
  Administered 2014-08-05 (×2): 3 [IU] via SUBCUTANEOUS
  Administered 2014-08-05 – 2014-08-06 (×2): 1 [IU] via SUBCUTANEOUS

## 2014-08-04 MED ORDER — HYOSCYAMINE SULFATE 0.125 MG SL SUBL
0.1250 mg | SUBLINGUAL_TABLET | SUBLINGUAL | Status: DC | PRN
  Administered 2014-08-05 (×2): 0.125 mg via SUBLINGUAL
  Filled 2014-08-04 (×3): qty 1

## 2014-08-04 MED ORDER — DARIFENACIN HYDROBROMIDE ER 7.5 MG PO TB24
7.5000 mg | ORAL_TABLET | Freq: Every day | ORAL | Status: DC
  Administered 2014-08-04 – 2014-08-06 (×3): 7.5 mg via ORAL
  Filled 2014-08-04 (×3): qty 1

## 2014-08-04 MED ORDER — ESOMEPRAZOLE MAGNESIUM 40 MG PO PACK: 40.0000 mg | PACK | Freq: Every day | ORAL | Status: DC | PRN

## 2014-08-04 MED ORDER — HYOSCYAMINE SULFATE 0.125 MG SL SUBL
0.1250 mg | SUBLINGUAL_TABLET | Freq: Once | SUBLINGUAL | Status: AC
  Administered 2014-08-04: 0.125 mg via SUBLINGUAL

## 2014-08-04 MED ORDER — HYOSCYAMINE SULFATE 0.125 MG SL SUBL: 0.1250 mg | SUBLINGUAL_TABLET | SUBLINGUAL | Status: DC | PRN

## 2014-08-04 MED ORDER — BELLADONNA ALKALOIDS-OPIUM 16.2-60 MG RE SUPP
1.0000 | Freq: Once | RECTAL | Status: AC
  Administered 2014-08-04: 1 via RECTAL

## 2014-08-04 MED ORDER — POLYETHYLENE GLYCOL 3350 17 G PO PACK: 17.0000 g | PACK | Freq: Every day | ORAL | Status: DC | PRN

## 2014-08-04 MED ORDER — ONDANSETRON HCL 4 MG PO TABS: 4.0000 mg | ORAL_TABLET | Freq: Four times a day (QID) | ORAL | Status: DC | PRN

## 2014-08-04 MED ORDER — PSYLLIUM 95 % PO PACK
1.0000 | PACK | Freq: Two times a day (BID) | ORAL | Status: DC
  Administered 2014-08-04 – 2014-08-06 (×3): 1 via ORAL
  Filled 2014-08-04 (×6): qty 1

## 2014-08-04 MED ORDER — ACETAMINOPHEN 650 MG RE SUPP: 650.0000 mg | Freq: Four times a day (QID) | RECTAL | Status: DC | PRN

## 2014-08-04 MED ORDER — CEFTRIAXONE SODIUM IN DEXTROSE 20 MG/ML IV SOLN
1.0000 g | INTRAVENOUS | Status: DC
  Administered 2014-08-05: 1 g via INTRAVENOUS
  Filled 2014-08-04: qty 50

## 2014-08-04 MED ORDER — PREDNISONE 20 MG PO TABS
40.0000 mg | ORAL_TABLET | Freq: Every day | ORAL | Status: DC
  Administered 2014-08-04 – 2014-08-06 (×3): 40 mg via ORAL
  Filled 2014-08-04 (×4): qty 2

## 2014-08-04 MED ORDER — SODIUM CHLORIDE 0.9 % IJ SOLN
3.0000 mL | Freq: Two times a day (BID) | INTRAMUSCULAR | Status: DC
  Administered 2014-08-04 – 2014-08-05 (×3): 3 mL via INTRAVENOUS

## 2014-08-04 MED ORDER — SODIUM CHLORIDE 0.9 % IV BOLUS (SEPSIS)
500.0000 mL | Freq: Once | INTRAVENOUS | Status: AC
  Administered 2014-08-04: 500 mL via INTRAVENOUS

## 2014-08-04 MED ORDER — ONDANSETRON HCL 4 MG/2ML IJ SOLN: 4.0000 mg | Freq: Three times a day (TID) | INTRAMUSCULAR | Status: DC | PRN

## 2014-08-04 MED ORDER — HYDROCODONE-ACETAMINOPHEN 5-325 MG PO TABS
1.0000 | ORAL_TABLET | ORAL | Status: DC | PRN
  Administered 2014-08-06 (×2): 1 via ORAL
  Filled 2014-08-04 (×2): qty 1

## 2014-08-04 MED ORDER — POLYVINYL ALCOHOL 1.4 % OP SOLN
1.0000 [drp] | OPHTHALMIC | Status: DC | PRN
  Filled 2014-08-04: qty 15

## 2014-08-04 MED ORDER — ACETAMINOPHEN 325 MG PO TABS: 650.0000 mg | ORAL_TABLET | Freq: Four times a day (QID) | ORAL | Status: DC | PRN

## 2014-08-04 MED ORDER — CEFTRIAXONE SODIUM 1 G IJ SOLR
1.0000 g | Freq: Once | INTRAMUSCULAR | Status: AC
  Administered 2014-08-04: 1 g via INTRAVENOUS
  Filled 2014-08-04: qty 10

## 2014-08-04 MED ORDER — ONDANSETRON HCL 4 MG/2ML IJ SOLN: 4.0000 mg | Freq: Four times a day (QID) | INTRAMUSCULAR | Status: DC | PRN

## 2014-08-04 MED ORDER — HYOSCYAMINE SULFATE 0.125 MG PO TABS
0.1250 mg | ORAL_TABLET | Freq: Once | ORAL | Status: DC
  Filled 2014-08-04: qty 1

## 2014-08-04 MED ORDER — LIDOCAINE HCL 2 % EX GEL
CUTANEOUS | Status: AC
  Administered 2014-08-04: 1
  Filled 2014-08-04: qty 10

## 2014-08-04 NOTE — Discharge Instructions (Signed)
Take medications as prescribed.  Follow up with Dr Annabell HowellsWrenn as scheduled.

## 2014-08-04 NOTE — ED Notes (Signed)
Pt has Foley in place, however, pt voided and leaked out on bed. Cleaned pt. Per pt, he didn't want bedpad or gown changed stating "that he would just we it again." Notified RN

## 2014-08-04 NOTE — ED Provider Notes (Signed)
CSN: [id number]     Arrival date & time 08/04/14  [id number] History   None    Chief Complaint  Patient presents with  . Urinary Retention     (Consider location/radiation/quality/duration/timing/severity/associated sxs/prior Treatment) HPI   Scott Nixon is a 74 y.o. male complaining of severe suprapubic discomfort.  He states that his full.  He catheter is not draining.  Patient has been seen 2 times for the same complaint in the last several hours.  Initially, the catheter was flushed, and a small clot was cleared.  On the second visit, there was no significant urinary retention.  Patient was given Levsin with little relief.  Pain is 10 out of 10 and no exacerbating or alleviating factors identified. Patient states that he is drinking "a lot" and no urine is coming out.  19 cc of fluid or visible on the bladder scan.  Urology: Isabel CapriceGrapey  Past Medical History  Diagnosis Date  . GERD (gastroesophageal reflux disease)   . HTN (hypertension)   . Nephrolithiasis   . Chronic cystitis   . Headache   . Anemia   . Right bundle branch block (RBBB)   . Benign essential tremor   . Type 2 diabetes mellitus   . Hyperlipidemia   . Diverticulosis of colon   . History of diverticulitis of colon     adx 07-06-2009 w/ abscess  . Gross hematuria   . CVF (colovesical fistula)     repair 03-23-2014  . History of gout   . Lower urinary tract symptoms (LUTS)   . Dysuria   . Itching     back, arms,  hand,  feet--  chronic itching since surgery in Dec 2015  . At risk for sleep apnea     STOP-BANG= 5       SENT TO PCP 07-20-2014   Past Surgical History  Procedure Laterality Date  . Rotator cuff repair Left 2005  . Arm surgery Left 1988    torn ligament  . Colonoscopy N/A 03/22/2014    Procedure: COLONOSCOPY;  Surgeon: Louis Meckelobert D Kaplan, MD;  Location: [facility];  Service: Endoscopy;  Laterality: N/A;  . Appendectomy N/A 03/23/2014    Procedure: APPENDECTOMY;  Surgeon: Karie SodaSteven Gross, MD;  Location:  [facility];  Service: General;  Laterality: N/A;  . Proctoscopy N/A 03/23/2014    Procedure: RIGID PROCTOSCOPY;  Surgeon: Karie SodaSteven Gross, MD;  Location: [facility];  Service: General;  Laterality: N/A;  . Laparoscopic partial colectomy N/A 03/23/2014    Procedure: LAPAROSCOPIC LEFT  COLECTOMY, SPLENIC FLEXURE MOBILIZATION,  APPENDECTOMY, REPAIR BLADDER, COLOVESICAL FISTULA;  Surgeon: Karie SodaSteven Gross, MD;  Location: [facility];  Service: General;  Laterality: N/A;  . Cystoscopy with stent placement Bilateral 03/23/2014    Procedure: CYSTOSCOPY WITH BILATERAL URETERAL CATHETER  PLACEMENT;  Surgeon: Valetta Fulleravid S Grapey, MD;  Location: [facility];  Service: Urology;  Laterality: Bilateral;  . 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 Eye surgery Right 1963    eyelid  . Inguinal hernia repair Right 1950  . Cystoscopy with biopsy N/A 07/24/2014    Procedure: CYSTOSCOPY WITH BIOPSY;  Surgeon: Barron Alvineavid Grapey, MD;  [facility];  Service: Urology;  Laterality: N/A;  . Cystoscopy w/ retrogrades Bilateral 07/24/2014    Procedure: CYSTOSCOPY WITH RETROGRADE PYELOGRAM;  Surgeon: Barron Alvineavid Grapey, MD;  [facility];  Service: Urology;  Laterality: Bilateral;  . Cystogram N/A 07/24/2014    Procedure: CYSTOGRAM;  Surgeon: Barron Alvineavid Grapey, MD;  Location: [facility];  Service: Urology;  Laterality: N/A;   Family History  Problem Relation Age of Onset  . Heart failure Father   . Emphysema Mother   . Colon cancer Neg Hx    History  Substance Use Topics  . Smoking status: Never Smoker   . Smokeless tobacco: Never Used  . Alcohol Use: No    Review of Systems  10 systems reviewed and found to be negative, except as noted in the HPI.  Allergies  Other  Home Medications   Prior to Admission medications   Medication Sig Start Date End Date Taking? Authorizing Provider  acetaminophen (TYLENOL) 500 MG tablet Take 500 mg by mouth every 6 (six) hours as needed for moderate pain  or headache.   Yes Historical Provider, MD  aspirin EC 81 MG tablet Take 81 mg by mouth daily.    Yes Historical Provider, MD  atenolol (TENORMIN) 25 MG tablet Take 25 mg by mouth every morning.  04/24/13  Yes Historical Provider, MD  Cholecalciferol (VITAMIN D PO) Take 1,000 Units by mouth daily.    Yes Historical Provider, MD  cyanocobalamin 500 MCG tablet Take 500 mcg by mouth 2 (two) times a week.    Yes Historical Provider, MD  esomeprazole (NEXIUM) 40 MG packet Take 40 mg by mouth daily as needed (Acid reflux).    Yes Historical Provider, MD  ezetimibe (ZETIA) 10 MG tablet Take 10 mg by mouth daily.  01/24/13  Yes Historical Provider, MD  HYDROcodone-acetaminophen (NORCO/VICODIN) 5-325 MG per tablet Take 1 tablet by mouth every 4 (four) hours as needed for moderate pain.  08/03/14  Yes Historical Provider, MD  hyoscyamine (LEVSIN/SL) 0.125 MG SL tablet Place 1 tablet (0.125 mg total) under the tongue every 4 (four) hours as needed for cramping (bladder pain). 08/04/14  Yes Marisa Severin, MD  IRON PO Take 1 tablet by mouth 2 (two) times a week.    Yes Historical Provider, MD  loratadine (CLARITIN) 10 MG tablet Take 10 mg by mouth every other day. As needed for allergies.   Yes Historical Provider, MD  metFORMIN (GLUCOPHAGE) 500 MG tablet Take 1,000 mg by mouth daily with breakfast.  07/27/13  Yes Historical Provider, MD  methylPREDNISolone (MEDROL DOSEPAK) 4 MG TBPK tablet Take as directed- 6 pills day1 then decrease by 1 every day until gone 07/24/14  Yes Barron Alvine, MD  Omega-3 Fatty Acids (FISH OIL) 1200 MG CAPS Take 1,200 mg by mouth once a week.    Yes Historical Provider, MD  phenazopyridine (PYRIDIUM) 200 MG tablet Take 1 tablet (200 mg total) by mouth 3 (three) times daily as needed (for sensation of urnary frequency with ca). 08/03/14  Yes Rolland Porter, MD  Polyvinyl Alcohol-Povidone (REFRESH OP) Place 1 drop into both eyes daily as needed (dry eyes).   Yes Historical Provider, MD  Psyllium  (METAMUCIL PO) Take by mouth daily. 2 Teaspoonful   Yes Historical Provider, MD  solifenacin (VESICARE) 5 MG tablet Take 5 mg by mouth every evening.   Yes Historical Provider, MD  tamsulosin (FLOMAX) 0.4 MG CAPS capsule Take 0.4 mg by mouth 2 (two) times daily.  09/11/13  Yes Historical Provider, MD  traMADol (ULTRAM) 50 MG tablet Take 50 mg by mouth every 6 (six) hours as needed for moderate pain.  08/02/14  Yes Historical Provider, MD  valsartan (DIOVAN) 40 MG tablet Take 40 mg by mouth every morning.   Yes Historical Provider, MD  vitamin C (ASCORBIC  ACID) 500 MG tablet Take 500 mg by mouth 2 (two) times daily.    Yes Historical Provider, MD   BP 139/54 mmHg  Pulse 76  Temp(Src) 97.7 F (36.5 C) (Oral)  Resp 18  SpO2 93% Physical Exam  Constitutional: He is oriented to person, place, and time. He appears well-developed and well-nourished.  Appears acutely uncomfortable  HENT:  Head: Normocephalic.  Eyes: Conjunctivae and EOM are normal.  Cardiovascular: Normal rate.   Pulmonary/Chest: Effort normal and breath sounds normal. No stridor. He has no wheezes. He has no rales. He exhibits no tenderness.  Abdominal: Soft. There is tenderness.  Suprapubic tenderness with no guarding or rebound  Musculoskeletal: Normal range of motion.  Neurological: He is alert and oriented to person, place, and time.  Psychiatric: He has a normal mood and affect.  Nursing note and vitals reviewed.   ED Course  Procedures (including critical care time)  CRITICAL CARE Performed by: Wynetta Emery   Total critical care time: 35  Critical care time was exclusive of separately billable procedures and treating other patients.  Critical care was necessary to treat or prevent imminent or life-threatening deterioration.  Critical care was time spent personally by me on the following activities: development of treatment plan with patient and/or surrogate as well as nursing, discussions with consultants,  evaluation of patient's response to treatment, examination of patient, obtaining history from patient or surrogate, ordering and performing treatments and interventions, ordering and review of laboratory studies, ordering and review of radiographic studies, pulse oximetry and re-evaluation of patient's condition.  Labs Review Labs Reviewed  URINALYSIS, ROUTINE W REFLEX MICROSCOPIC - Abnormal; Notable for the following:    Color, Urine RED (*)    APPearance TURBID (*)    Hgb urine dipstick LARGE (*)    Protein, ur >300 (*)    Nitrite POSITIVE (*)    Leukocytes, UA SMALL (*)    All other components within normal limits  CBC WITH DIFFERENTIAL/PLATELET - Abnormal; Notable for the following:    WBC 10.8 (*)    RBC 3.66 (*)    Hemoglobin 9.9 (*)    HCT 30.7 (*)    Neutrophils Relative % 85 (*)    Neutro Abs 9.1 (*)    Lymphocytes Relative 7 (*)    All other components within normal limits  BASIC METABOLIC PANEL - Abnormal; Notable for the following:    Sodium 124 (*)    Potassium 5.2 (*)    CO2 16 (*)    Glucose, Bld 151 (*)    BUN 55 (*)    Creatinine, Ser 2.22 (*)    Calcium 8.2 (*)    GFR calc non Af Amer 27 (*)    GFR calc Af Amer 32 (*)    All other components within normal limits  URINE CULTURE  URINE MICROSCOPIC-ADD ON    Imaging Review No results found.   EKG Interpretation None      MDM   Final diagnoses:  Obstructed Foley catheter, subsequent encounter  AKI (acute kidney injury)  Hyponatremia    Filed Vitals:   08/04/14 0830 08/04/14 0858 08/04/14 0904 08/04/14 0905  BP: 191/106  139/54 139/54  Pulse: 86  76 76  Temp:      TempSrc:   Other (Comment)   Resp:  18 18   SpO2: 98%  93% 93%    Medications  opium-belladonna (B&O SUPPRETTES) 16.2-60 MG suppository 1 suppository (1 suppository Rectal Given 08/04/14 0649)  sodium chloride 0.9 %  bolus 500 mL (500 mLs Intravenous New Bag/Given 08/04/14 0828)  diazepam (VALIUM) injection 5 mg (5 mg Intravenous  Given 08/04/14 0855)  morphine 4 MG/ML injection 4 mg (4 mg Intravenous Given 08/04/14 0855)  lidocaine (XYLOCAINE) 2 % jelly (1 application  Given by Other 08/04/14 0941)    Scott Nixon is a pleasant 74 y.o. male presenting with sensation of Foley not draining, bladder scan is negative however attending physician has used regular ultrasound and seen a full bladder. Foley is removed and 22 [place] three-way Foley is placed, we have irrigated with 300 mL and there is no clearing, small clots and bloody urine coming from the Foley. Will consult urology for their thoughts on CBI  Patient's creatinine is bumped to 2.2, his baseline is 1.7. 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 Will need a medical admission.  [facility] recommends starting CBI, will come to evaluate the Pt.   Discussed with [facility] Dr. Irene LimboGoodrich who will admit the patient to a MedSurg bed.      Wynetta Emeryicole Alijah Hyde, PA-C 08/04/14 1119  Richardean Canalavid H Yao, MD 08/07/14 [phone]

## 2014-08-04 NOTE — ED Notes (Signed)
MD at bedside to US bladder, per bladder scanner and US there is no urine in patient's bladder.

## 2014-08-04 NOTE — ED Notes (Signed)
Pt states his foley catheter is not draining  Pt has been seen here twice tonight for same

## 2014-08-04 NOTE — Progress Notes (Signed)
CBI seet to 200 drips/ min, Dr Mena GoesEskridge here, removed numerous small (5-mm dia) clots

## 2014-08-04 NOTE — Consult Note (Signed)
Consult: Gross hematuria Requested by: Dr. Norlene Campbelltter  History of Present Illness: Patient is a 74 year old male who developed gross hematuria earlier this week.  He has a complicated urologic history having undergone a colovesical fistula repair December 2015.  He presented with some left scrotal swelling February 2016 and was noted to have a left spermatic cord hematoma that was treated with conservative management with observation.  He then developed dysuria frequency and urgency.  Urine cultures only showed [id number] colony count summer not speciate it.  He was treated with Cipro and steroids.  He continued to have dysuria frequency and urgency with follow-up negative urine cultures. He underwent CT scan of the abdomen and pelvis March 2016 which showed a thickened bladder but no evidence of colovesical fistula.  The repair and colon appeared to be intact.  He was taken to the operating room for cystoscopy for a cystogram showed a smaller capacity bladder with bilateral vesicoureteral reflux.  Biopsy showed acute and chronic inflammation.  Of note during this time the patient reports steroids did improve his symptoms but they recurred.  Earlier this week he developed gross hematuria and was seen in the office where a Foley catheter was placed.  It was irrigated to clear but the patient developed clot retention last night which required a catheter change and clots to be irrigated from the bladder.  Once clot free was started on CBI in the emergency department.  He seen on the floor now CBI running normally and very light pink urine.  Patient feels like he is adequately drained and is not having urgency.  He does have a history of some chronic renal insufficiency with his BUN of 55 and creatinine 2.2 at the present time.  He is being resuscitated slowly.  His white count was 10.8.  He's had no fevers.  Of note, his Urine culture from the office 2 days ago was negative.  Past Medical History  Diagnosis Date  .  GERD (gastroesophageal reflux disease)   . HTN (hypertension)   . Nephrolithiasis   . Chronic cystitis   . Headache   . Anemia   . Right bundle branch block (RBBB)   . Benign essential tremor   . Type 2 diabetes mellitus   . Hyperlipidemia   . Diverticulosis of colon   . History of diverticulitis of colon     adx 07-06-2009 w/ abscess  . Gross hematuria   . CVF (colovesical fistula)     repair 03-23-2014  . History of gout   . Lower urinary tract symptoms (LUTS)   . Dysuria   . Itching     back, arms,  hand,  feet--  chronic itching since surgery in Dec 2015  . At risk for sleep apnea     STOP-BANG= 5       SENT TO PCP 07-20-2014   Past Surgical History  Procedure Laterality Date  . Rotator cuff repair Left 2005  . Arm surgery Left 1988    torn ligament  . Colonoscopy N/A 03/22/2014    Procedure: COLONOSCOPY;  Surgeon: Louis Meckelobert D Kaplan, MD;  Location: [facility];  Service: Endoscopy;  Laterality: N/A;  . Appendectomy N/A 03/23/2014    Procedure: APPENDECTOMY;  Surgeon: Karie SodaSteven Gross, MD;  Location: [facility];  Service: General;  Laterality: N/A;  . Proctoscopy N/A 03/23/2014    Procedure: RIGID PROCTOSCOPY;  Surgeon: Karie SodaSteven Gross, MD;  Location: [facility];  Service: General;  Laterality: N/A;  . Laparoscopic partial colectomy N/A 03/23/2014  Procedure: LAPAROSCOPIC LEFT  COLECTOMY, SPLENIC FLEXURE MOBILIZATION,  APPENDECTOMY, REPAIR BLADDER, COLOVESICAL FISTULA;  Surgeon: Karie Soda, MD;  Location: [facility];  Service: General;  Laterality: N/A;  . Cystoscopy with stent placement Bilateral 03/23/2014    Procedure: CYSTOSCOPY WITH BILATERAL URETERAL CATHETER  PLACEMENT;  Surgeon: Valetta Fuller, MD;  Location: [facility];  Service: Urology;  Laterality: Bilateral;  . 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 Eye surgery Right 1963    eyelid  . Inguinal hernia repair Right 1950  . Cystoscopy with biopsy N/A 07/24/2014    Procedure: CYSTOSCOPY WITH BIOPSY;  Surgeon: Barron Alvine, MD;   [facility];  Service: Urology;  Laterality: N/A;  . Cystoscopy w/ retrogrades Bilateral 07/24/2014    Procedure: CYSTOSCOPY WITH RETROGRADE PYELOGRAM;  Surgeon: Barron Alvine, MD;  Location: [facility];  Service: Urology;  Laterality: Bilateral;  . Cystogram N/A 07/24/2014    Procedure: CYSTOGRAM;  Surgeon: Barron Alvine, MD;  Location: [facility];  Service: Urology;  Laterality: N/A;    Home Medications:  Prescriptions prior to admission  Medication Sig Dispense Refill Last Dose  . acetaminophen (TYLENOL) 500 MG tablet Take 500 mg by mouth every 6 (six) hours as needed for moderate pain or headache.   Past Week at Unknown time  . aspirin EC 81 MG tablet Take 81 mg by mouth daily.    3 days  . atenolol (TENORMIN) 25 MG tablet Take 25 mg by mouth every morning.    08/03/2014 at 0900  . Cholecalciferol (VITAMIN D PO) Take 1,000 Units by mouth daily.    a week  . cyanocobalamin 500 MCG tablet Take 500 mcg by mouth 2 (two) times a week.    a week  . esomeprazole (NEXIUM) 40 MG packet Take 40 mg by mouth daily as needed (Acid reflux).    08/02/2014  . ezetimibe (ZETIA) 10 MG tablet Take 10 mg by mouth daily.    08/03/2014 at Unknown time  . HYDROcodone-acetaminophen (NORCO/VICODIN) 5-325 MG per tablet Take 1 tablet by mouth every 4 (four) hours as needed for moderate pain.    08/04/2014 at Unknown time  . hyoscyamine (LEVSIN/SL) 0.125 MG SL tablet Place 1 tablet (0.125 mg total) under the tongue every 4 (four) hours as needed for cramping (bladder pain). 15 tablet 0 hasnt had filled  . IRON PO Take 1 tablet by mouth 2 (two) times a week.    a week  . loratadine (CLARITIN) 10 MG tablet Take 10 mg by mouth every other day. As needed for allergies.   a week  . metFORMIN (GLUCOPHAGE) 500 MG tablet Take 1,000 mg by mouth daily with breakfast.    08/03/2014 at Unknown time  . methylPREDNISolone (MEDROL DOSEPAK) 4 MG TBPK tablet Take as directed- 6 pills day1  then decrease by 1 every day until gone 21 tablet 0 havent filled yet  . Omega-3 Fatty Acids (FISH OIL) 1200 MG CAPS Take 1,200 mg by mouth once a week.    08/03/2014 at Unknown time  . phenazopyridine (PYRIDIUM) 200 MG tablet Take 1 tablet (200 mg total) by mouth 3 (three) times daily as needed (for sensation of urnary frequency with ca). 16 tablet 0 havent filled yet  . Polyvinyl Alcohol-Povidone (REFRESH OP) Place 1 drop into both eyes daily as needed (dry eyes).   08/02/2014  . Psyllium (METAMUCIL PO) Take by mouth daily. 2 Teaspoonful   08/03/2014 at Unknown time  .  solifenacin (VESICARE) 5 MG tablet Take 5 mg by mouth every evening.   08/03/2014 at Unknown time  . tamsulosin (FLOMAX) 0.4 MG CAPS capsule Take 0.4 mg by mouth 2 (two) times daily.    08/03/2014 at Unknown time  . traMADol (ULTRAM) 50 MG tablet Take 50 mg by mouth every 6 (six) hours as needed for moderate pain.   0 08/03/2014 at Unknown time  . valsartan (DIOVAN) 40 MG tablet Take 40 mg by mouth every morning.   08/03/2014 at Unknown time  . vitamin C (ASCORBIC ACID) 500 MG tablet Take 500 mg by mouth 2 (two) times daily.    a week   Allergies:  Allergies  Allergen Reactions  . Other     Unknown medication that caused severe itching and scaling of hands and feet --  During admission Dec 2015    Family History  Problem Relation Age of Onset  . Heart failure Father   . Emphysema Mother   . Colon cancer Neg Hx    Social History:  reports that he has never smoked. He has never used smokeless tobacco. He reports that he does not drink alcohol or use illicit drugs.  ROS: A complete review of systems was performed.  All systems are negative except for pertinent findings as noted. ROS   Physical Exam:  Vital signs in last 24 hours: Temp:  [97.4 F (36.3 C)-98.5 F (36.9 C)] 98.5 F (36.9 C) (05/06 1157) Pulse Rate:  [58-93] 65 (05/06 1157) Resp:  [16-20] 18 (05/06 1157) BP: (108-191)/(43-106) 131/61 mmHg (05/06 1157) SpO2:  [93  %-100 %] 100 % (05/06 1157) General:  Alert and oriented, No acute distress HEENT: Normocephalic, atraumatic Neck: No JVD or lymphadenopathy Cardiovascular: Regular rate and rhythm Lungs: Regular rate and effort Abdomen: Soft, nontender, nondistended, no abdominal masses Back: No CVA tenderness Extremities: No edema Neurologic: Grossly intact GU: CBI running with light pink urine.  Procedure catheter irrigation-I irrigated the catheter with about 1 L of water and flushed out several small clots.  In the urine clears quickly.  Laboratory Data:  Results for orders placed or performed during the hospital encounter of 08/04/14 (from the past 24 hour(s))  CBC with Differential     Status: Abnormal   Collection Time: 08/04/14  8:31 AM  Result Value Ref Range   WBC 10.8 (H) 4.0 - 10.5 K/uL   RBC 3.66 (L) 4.22 - 5.81 MIL/uL   Hemoglobin 9.9 (L) 13.0 - 17.0 g/dL   HCT 16.130.7 (L) 09.639.0 - 04.552.0 %   MCV 83.9 78.0 - 100.0 fL   MCH 27.0 26.0 - 34.0 pg   MCHC 32.2 30.0 - 36.0 g/dL   RDW 40.915.2 81.111.5 - 91.415.5 %   Platelets 204 150 - 400 K/uL   Neutrophils Relative % 85 (H) 43 - 77 %   Neutro Abs 9.1 (H) 1.7 - 7.7 K/uL   Lymphocytes Relative 7 (L) 12 - 46 %   Lymphs Abs 0.7 0.7 - 4.0 K/uL   Monocytes Relative 7 3 - 12 %   Monocytes Absolute 0.8 0.1 - 1.0 K/uL   Eosinophils Relative 1 0 - 5 %   Eosinophils Absolute 0.1 0.0 - 0.7 K/uL   Basophils Relative 0 0 - 1 %   Basophils Absolute 0.0 0.0 - 0.1 K/uL  Basic metabolic panel     Status: Abnormal   Collection Time: 08/04/14  8:31 AM  Result Value Ref Range   Sodium 124 (L) 135 - 145  mmol/L   Potassium 5.2 (H) 3.5 - 5.1 mmol/L   Chloride 102 101 - 111 mmol/L   CO2 16 (L) 22 - 32 mmol/L   Glucose, Bld 151 (H) 70 - 99 mg/dL   BUN 55 (H) 6 - 20 mg/dL   Creatinine, Ser 5.62 (H) 0.61 - 1.24 mg/dL   Calcium 8.2 (L) 8.9 - 10.3 mg/dL   GFR calc non Af Amer 27 (L) >60 mL/min   GFR calc Af Amer 32 (L) >60 mL/min   Anion gap 6 5 - 15  Urinalysis,  Routine w reflex microscopic     Status: Abnormal   Collection Time: 08/04/14  9:30 AM  Result Value Ref Range   Color, Urine RED (A) YELLOW   APPearance TURBID (A) CLEAR   Specific Gravity, Urine 1.014 1.005 - 1.030   pH 6.0 5.0 - 8.0   Glucose, UA NEGATIVE NEGATIVE mg/dL   Hgb urine dipstick LARGE (A) NEGATIVE   Bilirubin Urine NEGATIVE NEGATIVE   Ketones, ur NEGATIVE NEGATIVE mg/dL   Protein, ur >130 (A) NEGATIVE mg/dL   Urobilinogen, UA 0.2 0.0 - 1.0 mg/dL   Nitrite POSITIVE (A) NEGATIVE   Leukocytes, UA SMALL (A) NEGATIVE  Urine microscopic-add on     Status: None   Collection Time: 08/04/14  9:30 AM  Result Value Ref Range   Urine-Other FIELD OBSCURED BY RBC'S    No results found for this or any previous visit (from the past 240 hour(s)). Creatinine:  Recent Labs  08/04/14 0831  CREATININE 2.22*    Impression/Assessment/Plan: Hemorrhagic cystitis-we'll continue CBI for now.  Etiology is uncertain. With the inflammation, the patient may have developed some non-compliance of the bladder and vesicoureteral reflux which might complicate his renal function.  We'll continue catheter drainage. Of note he did respond to steroids in the past and I'll discuss with medicine another round of steroids with a steroid taper. Urine Cx from the office two days ago is negative.    Nuno Brubacher 08/04/2014, 12:50 PM

## 2014-08-04 NOTE — ED Notes (Signed)
Pt was just recently discharged for urinary retention after his catheter was not draining. After getting home, pt reports that the catheter is again not draining.

## 2014-08-04 NOTE — ED Provider Notes (Signed)
CSN: [id number]     Arrival date & time 08/04/14  0201 History   First MD Initiated Contact with Patient 08/04/14 0208     Chief Complaint  Patient presents with  . Urinary Retention     (Consider location/radiation/quality/duration/timing/severity/associated sxs/prior Treatment) HPI 74 year old male presents to the emergency department with complaint of needing to urinate.  Patient had Foley placed at his urologist office due to urinary retention .  A before yesterday.  He has had problems with the catheter being blocked and has had irrigated once in the office and once earlier in the evening in the emergency department.  Per prior note, patient had blood and clot flushed from his catheter earlier.  Patient reports he did well.  Tonight until he got home and then felt as if he needed to urinate.  He has wife report that he has drank 64 ounces of water (initially said 24 ounces to triage nurse), but has not had much drainage from his catheter.  No fevers or chills. Past Medical History  Diagnosis Date  . GERD (gastroesophageal reflux disease)   . HTN (hypertension)   . Nephrolithiasis   . Chronic cystitis   . Headache   . Anemia   . Right bundle branch block (RBBB)   . Benign essential tremor   . Type 2 diabetes mellitus   . Hyperlipidemia   . Diverticulosis of colon   . History of diverticulitis of colon     adx 07-06-2009 w/ abscess  . Gross hematuria   . CVF (colovesical fistula)     repair 03-23-2014  . History of gout   . Lower urinary tract symptoms (LUTS)   . Dysuria   . Itching     back, arms,  hand,  feet--  chronic itching since surgery in Dec 2015  . At risk for sleep apnea     STOP-BANG= 5       SENT TO PCP 07-20-2014   Past Surgical History  Procedure Laterality Date  . Rotator cuff repair Left 2005  . Arm surgery Left 1988    torn ligament  . Colonoscopy N/A 03/22/2014    Procedure: COLONOSCOPY;  Surgeon: Louis Meckelobert D Kaplan, MD;  Location: [facility];  Service:  Endoscopy;  Laterality: N/A;  . Appendectomy N/A 03/23/2014    Procedure: APPENDECTOMY;  Surgeon: Karie SodaSteven Gross, MD;  Location: [facility];  Service: General;  Laterality: N/A;  . Proctoscopy N/A 03/23/2014    Procedure: RIGID PROCTOSCOPY;  Surgeon: Karie SodaSteven Gross, MD;  Location: [facility];  Service: General;  Laterality: N/A;  . Laparoscopic partial colectomy N/A 03/23/2014    Procedure: LAPAROSCOPIC LEFT  COLECTOMY, SPLENIC FLEXURE MOBILIZATION,  APPENDECTOMY, REPAIR BLADDER, COLOVESICAL FISTULA;  Surgeon: Karie SodaSteven Gross, MD;  Location: [facility];  Service: General;  Laterality: N/A;  . Cystoscopy with stent placement Bilateral 03/23/2014    Procedure: CYSTOSCOPY WITH BILATERAL URETERAL CATHETER  PLACEMENT;  Surgeon: Valetta Fulleravid S Grapey, MD;  Location: [facility];  Service: Urology;  Laterality: Bilateral;  . 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 Eye surgery Right 1963    eyelid  . Inguinal hernia repair Right 1950  . Cystoscopy with biopsy N/A 07/24/2014    Procedure: CYSTOSCOPY WITH BIOPSY;  Surgeon: Barron Alvineavid Grapey, MD;  [facility] [place];  Service: Urology;  Laterality: N/A;  . Cystoscopy w/ retrogrades Bilateral 07/24/2014    Procedure: CYSTOSCOPY WITH RETROGRADE PYELOGRAM;  Surgeon: Barron Alvineavid Grapey, MD;  [facility] [place];  Service: Urology;  Laterality: Bilateral;  . Cystogram N/A 07/24/2014    Procedure: CYSTOGRAM;  Surgeon: Barron Alvine, MD;  Location: [facility];  Service: Urology;  Laterality: N/A;   Family History  Problem Relation Age of Onset  . Heart failure Father   . Emphysema Mother   . Colon cancer Neg Hx    History  Substance Use Topics  . Smoking status: Never Smoker   . Smokeless tobacco: Never Used  . Alcohol Use: No    Review of Systems  See History of Present Illness; otherwise all other systems are reviewed and negative   Allergies  Other  Home Medications   Prior to Admission medications   Medication Sig Start Date End  Date Taking? Authorizing Provider  acetaminophen (TYLENOL) 500 MG tablet Take 500 mg by mouth every 6 (six) hours as needed for moderate pain or headache.    Historical Provider, MD  aspirin EC 81 MG tablet Take 81 mg by mouth daily.     Historical Provider, MD  atenolol (TENORMIN) 25 MG tablet Take 25 mg by mouth every morning.  04/24/13   Historical Provider, MD  Cholecalciferol (VITAMIN D PO) Take 1,000 Units by mouth daily.     Historical Provider, MD  cyanocobalamin 500 MCG tablet Take 500 mcg by mouth 2 (two) times a week.     Historical Provider, MD  esomeprazole (NEXIUM) 40 MG packet Take 40 mg by mouth daily as needed (Acid reflux).     Historical Provider, MD  ezetimibe (ZETIA) 10 MG tablet Take 10 mg by mouth daily.  01/24/13   Historical Provider, MD  hyoscyamine (LEVSIN/SL) 0.125 MG SL tablet Place 1 tablet (0.125 mg total) under the tongue every 4 (four) hours as needed for cramping (bladder pain). 08/04/14   Marisa Severin, MD  IRON PO Take 1 tablet by mouth 2 (two) times a week.     Historical Provider, MD  loratadine (CLARITIN) 10 MG tablet Take 10 mg by mouth every other day.    Historical Provider, MD  metFORMIN (GLUCOPHAGE) 500 MG tablet Take 1,000 mg by mouth daily with breakfast.  07/27/13   Historical Provider, MD  methylPREDNISolone (MEDROL DOSEPAK) 4 MG TBPK tablet Take as directed- 6 pills day1 then decrease by 1 every day until gone 07/24/14   Barron Alvine, MD  Omega-3 Fatty Acids (FISH OIL) 1200 MG CAPS Take 1,200 mg by mouth once a week.     Historical Provider, MD  phenazopyridine (PYRIDIUM) 200 MG tablet Take 1 tablet (200 mg total) by mouth 3 (three) times daily as needed (for sensation of urnary frequency with ca). 08/03/14   Rolland Porter, MD  Polyvinyl Alcohol-Povidone (REFRESH OP) Place 1 drop into both eyes daily as needed (dry eyes).    Historical Provider, MD  Psyllium (METAMUCIL PO) Take by mouth daily.    Historical Provider, MD  solifenacin (VESICARE) 5 MG tablet Take 5  mg by mouth every evening.    Historical Provider, MD  tamsulosin (FLOMAX) 0.4 MG CAPS capsule Take 0.4 mg by mouth 2 (two) times daily.  09/11/13   Historical Provider, MD  valsartan (DIOVAN) 40 MG tablet Take 40 mg by mouth every morning.    Historical Provider, MD  vitamin C (ASCORBIC ACID) 500 MG tablet Take 500 mg by mouth 2 (two) times daily.     Historical Provider, MD   BP 137/54 mmHg  Pulse 70  Temp(Src) 97.5 F (36.4 C) (Oral)  Resp 18  SpO2 98% Physical Exam  Constitutional: He is oriented to person, place, and time. He appears well-developed and well-nourished.  HENT:  Head: Normocephalic and atraumatic.  Nose: Nose normal.  Mouth/Throat: Oropharynx is clear and moist.  Eyes: Conjunctivae and EOM are normal. Pupils are equal, round, and reactive to light.  Neck: Normal range of motion. Neck supple. No JVD present. No tracheal deviation present. No thyromegaly present.  Cardiovascular: Normal rate, regular rhythm, normal heart sounds and intact distal pulses.  Exam reveals no gallop and no friction rub.   No murmur heard. Pulmonary/Chest: Effort normal and breath sounds normal. No stridor. No respiratory distress. He has no wheezes. He has no rales. He exhibits no tenderness.  Abdominal: Soft. Bowel sounds are normal. He exhibits no distension and no mass. There is tenderness (moderate suprapubic tenderness with palpation). There is no rebound and no guarding.  Musculoskeletal: Normal range of motion. He exhibits no edema or tenderness.  Lymphadenopathy:    He has no cervical adenopathy.  Neurological: He is alert and oriented to person, place, and time. He displays normal reflexes. He exhibits normal muscle tone. Coordination normal.  Skin: Skin is warm and dry. No rash noted. No erythema. No pallor.  Psychiatric: He has a normal mood and affect. His behavior is normal. Judgment and thought content normal.  Vitals reviewed.   ED Course  Procedures (including critical care  time) Labs Review Labs Reviewed - No data to display  Imaging Review No results found.   EKG Interpretation None      MDM   Final diagnoses:  Bladder spasms    74 year old male with suprapubic pain.  Catheter is draining clear urine.  Bedside ultrasound shows no urine in the bladder.  Suspect that he is having bladder irritability causing his symptoms.  Will start him on Levsin.  Patient to follow-up with Dr. Wilson SingerWren on Monday.    Marisa Severinlga Mahlani Berninger, MD 08/04/14 [id number]

## 2014-08-04 NOTE — ED Notes (Signed)
Patient states at midnight he emptied his catheter bag and has had 24oz of liquid since then. Patient states he is having pain in his bladder and feels like he needs to void.

## 2014-08-04 NOTE — ED Notes (Signed)
Patient & wife given instructions on how to change from leg bag to standard drainage bag, including maintaining bag lower than bladder.

## 2014-08-04 NOTE — ED Notes (Signed)
Patient given water, per patient request

## 2014-08-04 NOTE — H&P (Signed)
History and Physical  Scott Nixon ZOX:[id number] DOB: December 30, 1940 DOA: 08/04/2014  Referring PA: Wynetta Emery PCP: Ginette Otto, MD  Urologist: Dr. Isabel Caprice  Chief Complaint: catheter clogged  HPI:  74 year old man with history of gross hematuria, chronic cystitis, bilateral vesicoureteral reflux, followed by urology as an outpatient who presented to the emergency department for the third time with difficulty with his Foley catheter. Evaluation revealed clogged Foley catheter which was relieved with irrigation. Gross hematuria noted with clots. Case was discussed with urology who recommended continuous bladder irrigation and admission. Admitted for further evaluation of the above as well as acute kidney injury, hyperkalemia, acute blood loss anemia.  Patient's had intermittent hematuria which has worsened over the last few days to the point where he has had obstructive symptoms which were cleared in the emergency department only to quickly recur. He has had some associated pain and the area of the catheter as well as his bladder. No specific aggravating or alleviating factors except removal of pain with declogging of the catheter. He has had no fever or systemic symptoms and no other complaints.  In the emergency department afebrile, VSS, no hypoxia. Sodium 124, K+ 5.2, CO2 16; BUN up to 55, creatinine up to 1.7. Foley exchanged, gross hematuria, clots.  Review of Systems:  Negative for fever, visual changes, sore throat, rash, new muscle aches, chest pain, SOB, n/v/abdominal pain.  Past Medical History  Diagnosis Date  . GERD (gastroesophageal reflux disease)   . HTN (hypertension)   . Nephrolithiasis   . Chronic cystitis   . Headache   . Anemia   . Right bundle branch block (RBBB)   . Benign essential tremor   . Type 2 diabetes mellitus   . Hyperlipidemia   . Diverticulosis of colon   . History of diverticulitis of colon     adx 07-06-2009 w/ abscess  . Gross hematuria    . CVF (colovesical fistula)     repair 03-23-2014  . History of gout   . Lower urinary tract symptoms (LUTS)   . Dysuria   . Itching     back, arms,  hand,  feet--  chronic itching since surgery in Dec 2015  . At risk for sleep apnea     STOP-BANG= 5       SENT TO PCP 07-20-2014    Past Surgical History  Procedure Laterality Date  . Rotator cuff repair Left 2005  . Arm surgery Left 1988    torn ligament  . Colonoscopy N/A 03/22/2014    Procedure: COLONOSCOPY;  Surgeon: Louis Meckel, MD;  [facility]: [facility];  Service: Endoscopy;  Laterality: N/A;  . Appendectomy N/A 03/23/2014    Procedure: APPENDECTOMY;  Surgeon: Karie Soda, MD;  Location: [facility];  Service: General;  Laterality: N/A;  . Proctoscopy N/A 03/23/2014    Procedure: RIGID PROCTOSCOPY;  Surgeon: Karie Soda, MD;  Location: [facility];  Service: General;  Laterality: N/A;  . Laparoscopic partial colectomy N/A 03/23/2014    Procedure: LAPAROSCOPIC LEFT  COLECTOMY, SPLENIC FLEXURE MOBILIZATION,  APPENDECTOMY, REPAIR BLADDER, COLOVESICAL FISTULA;  Surgeon: Karie Soda, MD;  Location: [facility];  Service: General;  Laterality: N/A;  . Cystoscopy with stent placement Bilateral 03/23/2014    Procedure: CYSTOSCOPY WITH BILATERAL URETERAL CATHETER  PLACEMENT;  Surgeon: Valetta Fuller, MD;  Location: [facility];  Service: Urology;  Laterality: Bilateral;  . 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 Eye surgery Right 1963    eyelid  .  Inguinal hernia repair Right 1950  . Cystoscopy with biopsy N/A 07/24/2014    Procedure: CYSTOSCOPY WITH BIOPSY;  Surgeon: Barron Alvineavid Grapey, MD;  [facility] [place];  Service: Urology;  Laterality: N/A;  . Cystoscopy w/ retrogrades Bilateral 07/24/2014    Procedure: CYSTOSCOPY WITH RETROGRADE PYELOGRAM;  Surgeon: Barron Alvineavid Grapey, MD;  [facility] [place];  Service: Urology;  Laterality: Bilateral;  . Cystogram N/A 07/24/2014    Procedure: CYSTOGRAM;  Surgeon: Barron Alvineavid Grapey,  MD;  [facility] [place];  Service: Urology;  Laterality: N/A;    Social History:  reports that he has never smoked. He has never used smokeless tobacco. He reports that he does not drink alcohol or use illicit drugs. lives with fiancee [employer]  Allergies  Allergen Reactions  . Other     Unknown medication that caused severe itching and scaling of hands and feet --  During admission Dec 2015    Family History  Problem Relation Age of Onset  . Heart failure Father   . Emphysema Mother   . Colon cancer Neg Hx      Prior to Admission medications   Medication Sig Start Date End Date Taking? Authorizing Provider  acetaminophen (TYLENOL) 500 MG tablet Take 500 mg by mouth every 6 (six) hours as needed for moderate pain or headache.   Yes Historical Provider, MD  aspirin EC 81 MG tablet Take 81 mg by mouth daily.    Yes Historical Provider, MD  atenolol (TENORMIN) 25 MG tablet Take 25 mg by mouth every morning.  04/24/13  Yes Historical Provider, MD  Cholecalciferol (VITAMIN D PO) Take 1,000 Units by mouth daily.    Yes Historical Provider, MD  cyanocobalamin 500 MCG tablet Take 500 mcg by mouth 2 (two) times a week.    Yes Historical Provider, MD  esomeprazole (NEXIUM) 40 MG packet Take 40 mg by mouth daily as needed (Acid reflux).    Yes Historical Provider, MD  ezetimibe (ZETIA) 10 MG tablet Take 10 mg by mouth daily.  01/24/13  Yes Historical Provider, MD  HYDROcodone-acetaminophen (NORCO/VICODIN) 5-325 MG per tablet Take 1 tablet by mouth every 4 (four) hours as needed for moderate pain.  08/03/14  Yes Historical Provider, MD  hyoscyamine (LEVSIN/SL) 0.125 MG SL tablet Place 1 tablet (0.125 mg total) under the tongue every 4 (four) hours as needed for cramping (bladder pain). 08/04/14  Yes Marisa Severinlga Otter, MD  IRON PO Take 1 tablet by mouth 2 (two) times a week.    Yes Historical Provider, MD  loratadine (CLARITIN) 10 MG tablet Take 10 mg by mouth every other day. As needed  for allergies.   Yes Historical Provider, MD  metFORMIN (GLUCOPHAGE) 500 MG tablet Take 1,000 mg by mouth daily with breakfast.  07/27/13  Yes Historical Provider, MD  methylPREDNISolone (MEDROL DOSEPAK) 4 MG TBPK tablet Take as directed- 6 pills day1 then decrease by 1 every day until gone 07/24/14  Yes Barron Alvineavid Grapey, MD  Omega-3 Fatty Acids (FISH OIL) 1200 MG CAPS Take 1,200 mg by mouth once a week.    Yes Historical Provider, MD  phenazopyridine (PYRIDIUM) 200 MG tablet Take 1 tablet (200 mg total) by mouth 3 (three) times daily as needed (for sensation of urnary frequency with ca). 08/03/14  Yes Rolland PorterMark James, MD  Polyvinyl Alcohol-Povidone (REFRESH OP) Place 1 drop into both eyes daily as needed (dry eyes).   Yes Historical Provider, MD  Psyllium (METAMUCIL PO) Take by mouth daily. 2 Teaspoonful  Yes Historical Provider, MD  solifenacin (VESICARE) 5 MG tablet Take 5 mg by mouth every evening.   Yes Historical Provider, MD  tamsulosin (FLOMAX) 0.4 MG CAPS capsule Take 0.4 mg by mouth 2 (two) times daily.  09/11/13  Yes Historical Provider, MD  traMADol (ULTRAM) 50 MG tablet Take 50 mg by mouth every 6 (six) hours as needed for moderate pain.  08/02/14  Yes Historical Provider, MD  valsartan (DIOVAN) 40 MG tablet Take 40 mg by mouth every morning.   Yes Historical Provider, MD  vitamin C (ASCORBIC ACID) 500 MG tablet Take 500 mg by mouth 2 (two) times daily.    Yes Historical Provider, MD   Physical Exam: Filed Vitals:   08/04/14 0830 08/04/14 0858 08/04/14 0904 08/04/14 0905  BP: 191/106  139/54 139/54  Pulse: 86  76 76  Temp:      TempSrc:   Other (Comment)   Resp:  18 18   SpO2: 98%  93% 93%    General:  Appears calm and comfortable Eyes: PERRL, normal lids, irises   ENT: grossly normal hearing, lips   Neck: no LAD, masses or thyromegaly Cardiovascular: RRR, no m/r/g. No LE edema. Respiratory: CTA bilaterally, no w/r/r. Normal respiratory effort. Abdomen: soft, ntnd Skin: no rash or  induration noted Musculoskeletal: grossly normal tone BUE/BLE Psychiatric: grossly normal mood and affect, speech fluent and appropriate Neurologic: grossly non-focal.  Wt Readings from Last 3 Encounters:  07/24/14 97.523 kg (215 lb)  03/26/14 106.187 kg (234 lb 1.6 oz)  03/22/14 102.967 kg (227 lb)    Labs on Admission:  Basic Metabolic Panel:  Recent Labs Lab 08/04/14 0831  NA 124*  K 5.2*  CL 102  CO2 16*  GLUCOSE 151*  BUN 55*  CREATININE 2.22*  CALCIUM 8.2*     CBC:  Recent Labs Lab 08/04/14 0831  WBC 10.8*  NEUTROABS 9.1*  HGB 9.9*  HCT 30.7*  MCV 83.9  PLT 204     Radiological Exams on Admission: No results found.  EKG: none done   Active Problems:   Diabetes mellitus, type 2   Gross hematuria   AKI (acute kidney injury)   Obstructive uropathy   Hyperkalemia   Assessment/Plan 1. Acute on chronic urinary retention despite foley secondary to gross hematuria, obstructive uropathy.  2. AKI with hyperkalemia, hyponatremia secondary to obstructive uropathy. Expect spontaneously resolution with drainage.  3. ABLA, gross hematuria.  4. Possible UTI, chronic cysititis. 5. DM type 2. Random CBG 151. Hold metformin.   Admit to medical bed  CBI, management per urology  BMP in AM  Trend CBC; no acute indication for transfusion  Send urine culture  SSI  Code Status: full code  DVT prophylaxis: SCDs Family Communication: none present, pt has capacity and understands plan Disposition Plan/Anticipated LOS: admit, 2-3 days  Time spent: 55 minutes  Brendia Sacksaniel [place] Sloop, MD  [facility] Pager [phone] 08/04/2014, 10:48 AM

## 2014-08-04 NOTE — ED Notes (Signed)
Foley irrigated w/ 30 ml, uncomfortable for pt, leaking around catheter when flushed, no return in catheter bag.

## 2014-08-05 DIAGNOSIS — I4510 Unspecified right bundle-branch block: Secondary | ICD-10-CM | POA: Diagnosis present

## 2014-08-05 DIAGNOSIS — Y846 Urinary catheterization as the cause of abnormal reaction of the patient, or of later complication, without mention of misadventure at the time of the procedure: Secondary | ICD-10-CM | POA: Diagnosis present

## 2014-08-05 DIAGNOSIS — E871 Hypo-osmolality and hyponatremia: Secondary | ICD-10-CM | POA: Diagnosis present

## 2014-08-05 DIAGNOSIS — N3091 Cystitis, unspecified with hematuria: Secondary | ICD-10-CM

## 2014-08-05 DIAGNOSIS — E785 Hyperlipidemia, unspecified: Secondary | ICD-10-CM | POA: Diagnosis present

## 2014-08-05 DIAGNOSIS — K219 Gastro-esophageal reflux disease without esophagitis: Secondary | ICD-10-CM | POA: Diagnosis present

## 2014-08-05 DIAGNOSIS — E875 Hyperkalemia: Secondary | ICD-10-CM | POA: Diagnosis not present

## 2014-08-05 DIAGNOSIS — N138 Other obstructive and reflux uropathy: Secondary | ICD-10-CM | POA: Diagnosis present

## 2014-08-05 DIAGNOSIS — Z79891 Long term (current) use of opiate analgesic: Secondary | ICD-10-CM | POA: Diagnosis not present

## 2014-08-05 DIAGNOSIS — R310 Gross hematuria: Secondary | ICD-10-CM | POA: Diagnosis not present

## 2014-08-05 DIAGNOSIS — N179 Acute kidney failure, unspecified: Secondary | ICD-10-CM | POA: Diagnosis not present

## 2014-08-05 DIAGNOSIS — Z8249 Family history of ischemic heart disease and other diseases of the circulatory system: Secondary | ICD-10-CM | POA: Diagnosis not present

## 2014-08-05 DIAGNOSIS — N3021 Other chronic cystitis with hematuria: Secondary | ICD-10-CM | POA: Diagnosis present

## 2014-08-05 DIAGNOSIS — I129 Hypertensive chronic kidney disease with stage 1 through stage 4 chronic kidney disease, or unspecified chronic kidney disease: Secondary | ICD-10-CM | POA: Diagnosis present

## 2014-08-05 DIAGNOSIS — G250 Essential tremor: Secondary | ICD-10-CM | POA: Diagnosis present

## 2014-08-05 DIAGNOSIS — Z7982 Long term (current) use of aspirin: Secondary | ICD-10-CM | POA: Diagnosis not present

## 2014-08-05 DIAGNOSIS — T8389XA Other specified complication of genitourinary prosthetic devices, implants and grafts, initial encounter: Secondary | ICD-10-CM | POA: Diagnosis present

## 2014-08-05 DIAGNOSIS — E119 Type 2 diabetes mellitus without complications: Secondary | ICD-10-CM | POA: Diagnosis present

## 2014-08-05 DIAGNOSIS — D62 Acute posthemorrhagic anemia: Secondary | ICD-10-CM | POA: Diagnosis present

## 2014-08-05 DIAGNOSIS — N1370 Vesicoureteral-reflux, unspecified: Secondary | ICD-10-CM | POA: Diagnosis present

## 2014-08-05 DIAGNOSIS — T83098A Other mechanical complication of other indwelling urethral catheter, initial encounter: Secondary | ICD-10-CM | POA: Diagnosis present

## 2014-08-05 DIAGNOSIS — N189 Chronic kidney disease, unspecified: Secondary | ICD-10-CM | POA: Diagnosis present

## 2014-08-05 DIAGNOSIS — Z825 Family history of asthma and other chronic lower respiratory diseases: Secondary | ICD-10-CM | POA: Diagnosis not present

## 2014-08-05 DIAGNOSIS — Z7952 Long term (current) use of systemic steroids: Secondary | ICD-10-CM | POA: Diagnosis not present

## 2014-08-05 DIAGNOSIS — Z889 Allergy status to unspecified drugs, medicaments and biological substances status: Secondary | ICD-10-CM | POA: Diagnosis not present

## 2014-08-05 DIAGNOSIS — M109 Gout, unspecified: Secondary | ICD-10-CM | POA: Diagnosis present

## 2014-08-05 DIAGNOSIS — Z87442 Personal history of urinary calculi: Secondary | ICD-10-CM | POA: Diagnosis not present

## 2014-08-05 DIAGNOSIS — Z79899 Other long term (current) drug therapy: Secondary | ICD-10-CM | POA: Diagnosis not present

## 2014-08-05 LAB — BASIC METABOLIC PANEL
ANION GAP: 7 (ref 5–15)
BUN: 48 mg/dL — ABNORMAL HIGH (ref 6–20)
CALCIUM: 8.8 mg/dL — AB (ref 8.9–10.3)
CHLORIDE: 109 mmol/L (ref 101–111)
CO2: 17 mmol/L — ABNORMAL LOW (ref 22–32)
CREATININE: 1.71 mg/dL — AB (ref 0.61–1.24)
GFR, EST AFRICAN AMERICAN: 44 mL/min — AB (ref >60)
GFR, EST NON AFRICAN AMERICAN: 38 mL/min — AB (ref >60)
Glucose, Bld: 184 mg/dL — ABNORMAL HIGH (ref 70–99)
POTASSIUM: 5.5 mmol/L — AB (ref 3.5–5.1)
Sodium: 133 mmol/L — ABNORMAL LOW (ref 135–145)

## 2014-08-05 LAB — CBC
HEMATOCRIT: 30 % — AB (ref 39.0–52.0)
Hemoglobin: 9.5 g/dL — ABNORMAL LOW (ref 13.0–17.0)
MCH: 26.7 pg (ref 26.0–34.0)
MCHC: 31.7 g/dL (ref 30.0–36.0)
MCV: 84.3 fL (ref 78.0–100.0)
Platelets: 193 10*3/uL (ref 150–400)
RBC: 3.56 MIL/uL — ABNORMAL LOW (ref 4.22–5.81)
RDW: 15.2 % (ref 11.5–15.5)
WBC: 6.9 10*3/uL (ref 4.0–10.5)

## 2014-08-05 LAB — GLUCOSE, CAPILLARY
GLUCOSE-CAPILLARY: 169 mg/dL — AB (ref 70–99)
GLUCOSE-CAPILLARY: 126 mg/dL — AB (ref 70–99)
Glucose-Capillary: 201 mg/dL — ABNORMAL HIGH (ref 70–99)
Glucose-Capillary: 205 mg/dL — ABNORMAL HIGH (ref 70–99)

## 2014-08-05 LAB — URINE CULTURE: Colony Count: 4000

## 2014-08-05 MED ORDER — CIPROFLOXACIN HCL 250 MG PO TABS
250.0000 mg | ORAL_TABLET | Freq: Every day | ORAL | Status: DC
  Administered 2014-08-05 – 2014-08-06 (×2): 250 mg via ORAL
  Filled 2014-08-05 (×2): qty 1

## 2014-08-05 MED ORDER — SODIUM POLYSTYRENE SULFONATE 15 GM/60ML PO SUSP
15.0000 g | Freq: Once | ORAL | Status: AC
  Administered 2014-08-05: 15 g via ORAL
  Filled 2014-08-05: qty 60

## 2014-08-05 NOTE — Progress Notes (Signed)
Utilization Review completed.  

## 2014-08-05 NOTE — Progress Notes (Signed)
PROGRESS NOTE  Scott Nixon [id number] DOB: 1940-09-03 DOA: 08/04/2014 PCP: Ginette OttoSTONEKING,HAL THOMAS, MD  Summary: 74 year old man with history of gross hematuria, chronic cystitis, bilateral vesicoureteral reflux, followed by urology as an outpatient who presented to the emergency department for the third time with difficulty with his Foley catheter. Evaluation revealed clogged Foley catheter which was relieved with irrigation. Gross hematuria noted with clots. Case was discussed with urology who recommended continuous bladder irrigation and admission. Admitted for further evaluation of the above as well as acute kidney injury, hyperkalemia, acute blood loss anemia.  Assessment/Plan: 1. Gross hematuria, hemorrhagic cystitis. Per urology recommendations, continue steroids for total 2 weeks as well as ciprofloxacin daily for total 2 weeks. 2. Obstructive uropathy secondary to gross hematuria. Resolved with continuous bladder irrigation. Foley draining clear. Will urine. 3. Acute kidney injury with hyperkalemia, hyponatremia. Secondary to obstructive uropathy. Renal function improving. 4. Hyperkalemia. Treat with Kayexalate, repeat basic metabolic panel in the morning. Secondary to acute renal failure. 5. Acute blood loss anemia secondary to gross hematuria. Stable. Bleeding seems to have resolved. 6. Diabetes mellitus type 2, stable.   Markedly improved.  Change to oral ciprofloxacin 5/8. Cipro and steroids on discharge.  Repeat basic metabolic panel in the morning. Kayexalate 1.  Anticipate discharge 5/8  Code Status: full code DVT prophylaxis: SCDs Family Communication: none present, pt has capacity and agrees with plan Disposition Plan: Home  Brendia Sacksaniel Tyri Elmore, MD  [facility]  Pager [phone] If 7PM-7AM, please contact night-coverage at [email or web address], password [email or web address] 08/05/2014, 2:32 PM  LOS: 1 day   Consultants:  urology  Procedures:  Continuous bladder  irrigation  Antibiotics:  Ceftriaxone 5/6 >> 5/7  Ciprofloxacin 5/8 >>  HPI/Subjective: Feels much better. Some pain with the Foley catheter and some burning. Bleeding seems to have improved significantly. No nausea or vomiting. Eating lunch.  Objective: Filed Vitals:   08/04/14 1157 08/04/14 1400 08/04/14 2055 08/05/14 0515  BP: 131/61 127/67 134/57 141/61  Pulse: 65 73 71 63  Temp: 98.5 F (36.9 C) 98.3 F (36.8 C) 98.2 F (36.8 C) 97.8 F (36.6 C)  TempSrc: Oral Oral Oral Oral  Resp: 18 18 20 16   SpO2: 100% 100% 96% 97%    Intake/Output Summary (Last 24 hours) at 08/05/14 1432 Last data filed at 08/05/14 1129  Gross per 24 hour  Intake   1083 ml  Output  [id number] ml  Net -18167 ml     There were no vitals filed for this visit.  Exam:     Afebrile, temperature 97.8, respiratory rate 16, pulse 63, blood pressure 141/67. SPO2 97% on room air. General:  Appears calm and comfortable Cardiovascular: RRR, no m/r/g.   Respiratory: CTA bilaterally, no w/r/r. Normal respiratory effort. Psychiatric: grossly normal mood and affect, speech fluent and appropriate  New data reviewed:  Blood sugar stable.  Sodium 124 >> 133  Potassium 5.5  CO2 16 >> 17  Creatinine 2.2 >> 1.71  Hemoglobin 9.8 >> 9.5  Pertinent data since admission:    Pending data:  Urine culture  Scheduled Meds: . cefTRIAXone (ROCEPHIN)  IV  1 g Intravenous Q24H  . darifenacin  7.5 mg Oral Daily  . insulin aspart  0-9 Units Subcutaneous Q6H  . loratadine  10 mg Oral QODAY  . predniSONE  40 mg Oral Q breakfast  . psyllium  1 packet Oral BID  . sodium chloride  3 mL Intravenous Q12H  . tamsulosin  0.4 mg Oral BID   Continuous Infusions:  Active Problems:   Diabetes mellitus, type 2   Gross hematuria   AKI (acute kidney injury)   Obstructive uropathy   Hyperkalemia   Time spent 20 minutes

## 2014-08-05 NOTE — Progress Notes (Signed)
  Subjective: Patient reports no complaints. Looks and feels much better.   Objective: Vital signs in last 24 hours: Temp:  [97.8 F (36.6 C)-98.5 F (36.9 C)] 97.8 F (36.6 C) (05/07 0515) Pulse Rate:  [63-76] 63 (05/07 0515) Resp:  [16-20] 16 (05/07 0515) BP: (127-141)/(54-67) 141/61 mmHg (05/07 0515) SpO2:  [93 %-100 %] 97 % (05/07 0515)  Intake/Output from previous day: 05/06 0701 - 05/07 0700 In: 980 [P.O.:480; I.V.:500] Out: [id number] [Urine:24875] Intake/Output this shift: Total I/O In: -  Out: 950 [Urine:950]  Physical Exam:  Eating breakfast, watching TV GU - urine clear  Lab Results:  Recent Labs  08/04/14 0831 08/04/14 1641 08/05/14 0355  HGB 9.9* 9.8* 9.5*  HCT 30.7* 30.4* 30.0*   BMET  Recent Labs  08/04/14 0831 08/05/14 0355  NA 124* 133*  K 5.2* 5.5*  CL 102 109  CO2 16* 17*  GLUCOSE 151* 184*  BUN 55* 48*  CREATININE 2.22* 1.71*  CALCIUM 8.2* 8.8*   No results for input(s): LABPT, INR in the last 72 hours. No results for input(s): LABURIN in the last 72 hours. Results for orders placed or performed during the hospital encounter of 06/24/14  Urine culture     Status: None   Collection Time: 06/24/14  5:04 PM  Result Value Ref Range Status   Specimen Description URINE, CLEAN CATCH  Final   Special Requests Normal  Final   Colony Count NO GROWTH Performed at [facility]   Final   Culture NO GROWTH Performed at [facility]   Final   Report Status 06/25/2014 FINAL  Final    Studies/Results: No results found.  Assessment/Plan: Hemorrhagic cystitis - resolved. Stop CBI. If remains clear x 24 hrs can go home tomorrow. I talked with Dr. Irene LimboGoodrich yesterday and asked him to keep the patient on antibiotics and also start him on steroids for a 2 week taper course. Urine Cx pending. It seems like the patient has somewhat improved with antibiotics and steroids but may not have had a long enough course. I would taper steroids and  keep on a cephalexin or Cipro daily (not BID) for about 2 weeks if urine cx comes back negative again.     LOS: 1 day   Atiya Yera 08/05/2014, 9:01 AM

## 2014-08-06 DIAGNOSIS — N3091 Cystitis, unspecified with hematuria: Secondary | ICD-10-CM

## 2014-08-06 DIAGNOSIS — E119 Type 2 diabetes mellitus without complications: Secondary | ICD-10-CM

## 2014-08-06 LAB — GLUCOSE, CAPILLARY
GLUCOSE-CAPILLARY: 128 mg/dL — AB (ref 70–99)
GLUCOSE-CAPILLARY: 98 mg/dL (ref 70–99)

## 2014-08-06 LAB — BASIC METABOLIC PANEL
Anion gap: 9 (ref 5–15)
BUN: 52 mg/dL — AB (ref 6–20)
CO2: 19 mmol/L — AB (ref 22–32)
Calcium: 8.9 mg/dL (ref 8.9–10.3)
Chloride: 109 mmol/L (ref 101–111)
Creatinine, Ser: 1.79 mg/dL — ABNORMAL HIGH (ref 0.61–1.24)
GFR calc Af Amer: 41 mL/min — ABNORMAL LOW (ref >60)
GFR calc non Af Amer: 36 mL/min — ABNORMAL LOW (ref >60)
GLUCOSE: 126 mg/dL — AB (ref 70–99)
POTASSIUM: 4.6 mmol/L (ref 3.5–5.1)
Sodium: 137 mmol/L (ref 135–145)

## 2014-08-06 MED ORDER — PREDNISONE 10 MG PO TABS: ORAL_TABLET | ORAL | Status: DC

## 2014-08-06 MED ORDER — CIPROFLOXACIN HCL 250 MG PO TABS: 250.0000 mg | ORAL_TABLET | Freq: Every day | ORAL | Status: DC

## 2014-08-06 NOTE — Discharge Instructions (Signed)
Nutrition Post Hospital Stay °Proper nutrition can help your body recover from illness and injury.   °Foods and beverages high in protein, vitamins, and minerals help rebuild muscle loss, promote healing, & reduce fall risk.  ° °•In addition to eating healthy foods, a nutrition shake is an easy, delicious way to get the nutrition you need during and after your hospital stay ° °It is recommended that you continue to drink 2 bottles per day of:       Ensure Enlive for at least 1 month (30 days) after your hospital stay  ° °Tips for adding a nutrition shake into your routine: °As allowed, drink one with vitamins or medications instead of water or juice °Enjoy one as a tasty mid-morning or afternoon snack °Drink cold or make a milkshake out of it °Drink one instead of milk with cereal or snacks °Use as a coffee creamer °  °Available at the following grocery stores and pharmacies:           °* [employer] * [employer] * [employer]  °* [employer]          * [employer] * [employer]  °* [facility]      * Target  * [employer]'s   °* [facility]  * [employer]   °* [facility] [phone]  °          °For COUPONS visit: www.ensure.com/join or www.[email or web address]/members/sign-[email or web address]  ° °Suggested Substitutions °Ensure Plus = Boost Plus = Carnation Breakfast Essentials = Boost Compact °Ensure Active Clear = Boost Breeze °Glucerna Shake = Boost Glucose Control = Carnation Breakfast Essentials SUGAR FREE ° °  ° °

## 2014-08-06 NOTE — Progress Notes (Signed)
Nutrition Brief Note  Patient identified on the Malnutrition Screening Tool (MST) Report  Pt with reported 50 lb weight loss over the last 5 months. Pt's appetite and PO intake has improved, intake:90%. Pt and wife report pt is to be discharged today.   Discussed healthy eating with pt per pt's request. Answered all questions. Encouraged pt to consume nutritional supplements such as Ensure/Boost or protein powders at home d/t recent weight loss.  Wt Readings from Last 15 Encounters:  07/24/14 215 lb (97.523 kg)  03/26/14 234 lb 1.6 oz (106.187 kg)  03/22/14 227 lb (102.967 kg)  03/07/14 237 lb 6.4 oz (107.684 kg)  02/06/14 240 lb 2 oz (108.92 kg)  09/22/13 150 lb (68.04 kg)    Current diet order is CHO modified, patient is consuming approximately 90% of meals at this time. Labs and medications reviewed.   No nutrition interventions warranted at this time. If nutrition issues arise, please consult RD.   Tilda FrancoLindsey Kattleya Kuhnert, MS, RD, LDN Pager: [phone] After Hours Pager: [phone]

## 2014-08-06 NOTE — Progress Notes (Signed)
PROGRESS NOTE  Anner CreteGeorge H Wendorff ZOX:[id number] DOB: 1941-03-17 DOA: 08/04/2014 PCP: Ginette OttoSTONEKING,HAL THOMAS, MD  Summary: 74 year old man with history of gross hematuria, chronic cystitis, bilateral vesicoureteral reflux, followed by urology as an outpatient who presented to the emergency department for the third time with difficulty with his Foley catheter. Evaluation revealed clogged Foley catheter which was relieved with irrigation. Gross hematuria noted with clots. Case was discussed with urology who recommended continuous bladder irrigation and admission. Admitted for further evaluation of the above as well as acute kidney injury, hyperkalemia, acute blood loss anemia.  Assessment/Plan: 1. Gross hematuria, hemorrhagic cystitis. Per urology recommendations, continue steroids for total 2 weeks as well as ciprofloxacin daily for total 2 weeks. 2. Obstructive uropathy secondary to gross hematuria. Resolved with continuous bladder irrigation. Foley draining clear urine. 3. Acute kidney injury with hyperkalemia, hyponatremia. Secondary to obstructive uropathy. Renal function close to baseline. 4. Hyperkalemia. Resolved, secondary to acute renal failure. 5. Acute blood loss anemia secondary to gross hematuria. Stable. Bleeding resolved. 6. Diabetes mellitus type 2, stable.   Doing well, no bleeding.  Home today on prednisone and Cipro as per urology  Has follow-up with urology in AM.  Resume aspirin in 1 week  Hold valsartan based on BP  Stop metformin, blood sugars well-controlled. Pt will check blood sugars and I will d/w Dr. Pete GlatterStoneking in AM.  Brendia Sacksaniel Briya Lookabaugh, MD  [facility]  Pager [phone] If 7PM-7AM, please contact night-coverage at [email or web address], password [email or web address] 08/06/2014, 11:17 AM  LOS: 2 days   Consultants:  urology  Procedures:  Continuous bladder irrigation  Antibiotics:  Ceftriaxone 5/6 >> 5/7  Ciprofloxacin 5/8 >>  HPI/Subjective: Feels better, no pain, no  n/v. No pain.  Objective: Filed Vitals:   08/05/14 0515 08/05/14 1415 08/05/14 2029 08/06/14 0502  BP: 141/61 143/58 150/57 133/57  Pulse: 63 73 77 64  Temp: 97.8 F (36.6 C) 98.1 F (36.7 C) 97.5 F (36.4 C) 97.5 F (36.4 C)  TempSrc: Oral Oral Oral Oral  Resp: 16 18 20 18   SpO2: 97% 98% 99% 99%    Intake/Output Summary (Last 24 hours) at 08/06/14 1117 Last data filed at 08/06/14 0900  Gross per 24 hour  Intake    960 ml  Output   4350 ml  Net  -3390 ml     There were no vitals filed for this visit.  Exam:     Afebrile, 97.5, HR 64, RR 18, BP 133/57, 99% RA General: Appears comfortable, calm. Cardiovascular: Regular rate and rhythm, no murmur, rub or gallop.  Respiratory: Clear to auscultation bilaterally, no wheezes, rales or rhonchi. Normal respiratory effort. Abdomen: soft, ntnd Psychiatric: grossly normal mood and affect, speech fluent and appropriate  New data reviewed:  Blood sugars stable.  Sodium 124 >> 133 >> 137  Potassium 5.5 >> 4.6  CO2 16 >> 17 >> 19  Creatinine 2.2 >> 1.71 >> 1.79  Pertinent data since admission:    Pending data:  Urine culture  Scheduled Meds: . ciprofloxacin  250 mg Oral Daily  . darifenacin  7.5 mg Oral Daily  . insulin aspart  0-9 Units Subcutaneous Q6H  . loratadine  10 mg Oral QODAY  . predniSONE  40 mg Oral Q breakfast  . psyllium  1 packet Oral BID  . sodium chloride  3 mL Intravenous Q12H  . tamsulosin  0.4 mg Oral BID   Continuous Infusions:   Active Problems:   Diabetes mellitus, type 2   Gross hematuria   AKI (  acute kidney injury)   Obstructive uropathy   Hyperkalemia

## 2014-08-06 NOTE — Progress Notes (Addendum)
  Subjective: Patient reports a few small clots.   Objective: Vital signs in last 24 hours: Temp:  [97.5 F (36.4 C)-98.1 F (36.7 C)] 97.5 F (36.4 C) (05/08 0502) Pulse Rate:  [64-77] 64 (05/08 0502) Resp:  [18-20] 18 (05/08 0502) BP: (133-150)/(57-58) 133/57 mmHg (05/08 0502) SpO2:  [98 %-99 %] 99 % (05/08 0502)  Intake/Output from previous day: 05/07 0701 - 05/08 0700 In: 1253 [P.O.:1200; I.V.:3; IV Piggyback:50] Out: 5450 [Urine:5450] Intake/Output this shift:    Physical Exam:  NAD GU: urine clear to light pink   Lab Results:  Recent Labs  08/04/14 0831 08/04/14 1641 08/05/14 0355  HGB 9.9* 9.8* 9.5*  HCT 30.7* 30.4* 30.0*   BMET  Recent Labs  08/05/14 0355 08/06/14 0424  NA 133* 137  K 5.5* 4.6  CL 109 109  CO2 17* 19*  GLUCOSE 184* 126*  BUN 48* 52*  CREATININE 1.71* 1.79*  CALCIUM 8.8* 8.9   No results for input(s): LABPT, INR in the last 72 hours. No results for input(s): LABURIN in the last 72 hours. Results for orders placed or performed during the hospital encounter of 08/04/14  Urine culture     Status: None   Collection Time: 08/04/14  9:30 AM  Result Value Ref Range Status   Specimen Description URINE, CLEAN CATCH  Final   Special Requests NONE  Final   Colony Count   Final    4,000 COLONIES/ML Performed at [facility]    Culture   Final    INSIGNIFICANT GROWTH Performed at [facility]    Report Status 08/05/2014 FINAL  Final    Studies/Results: No results found.  Assessment/Plan: -hemorrhagic cystitis - resolved. Very light hematuria which might even be expected with a catheter. He is stable for discharge from my point of view. I would continue foley catheter drainage. Patient has an appointment in out office in the morning at 10 AM. His kidney function has returned to baseline. His urine Cx grew 4K CFU and like prior cultures no sensitivities were done. Prior cx's grew 6k - 7 k cfu. I called lab and asked  them to get sensitivities if possible. Agree with Cipro and steroids.    LOS: 2 days   [name] 08/06/2014, 9:55 AM

## 2014-08-06 NOTE — Discharge Summary (Signed)
Physician Discharge Summary  Scott Nixon OZH:[id number] DOB: [birth year] DOA: 08/04/2014  PCP: Scott Nixon  Admit date: 08/04/2014 Discharge date: 08/06/2014  Recommendations for Outpatient Follow-up:  1. Gross hematuria, hemorrhagic cystitis, see below 2. Chronic kidney disease, acute kidney injury 3. Acute blood loss anemia secondary to gross hematuria 4. Diabetes mellitus type 2. Metformin discontinued given his renal function. Plan to discuss with his primary care physician. Blood sugars have been very well controlled even with steroids. Patient advised to check blood sugars.  5. Valsartan on hold given acute kidney injury, blood pressure well controlled. Atenolol was previously stopped by his primary care physician for low normal blood pressures. 6. Can resume aspirin one week   Follow-up Information    Follow up with Scott Nixon On 08/18/2014.   Specialty:  Internal Medicine   Why:  keep scheduled appointment   Contact information:   [address] [place] [address] [phone]       Follow up with Scott Nixon On 08/07/2014.   Specialty:  Urology   Why:  keep follow-up appointment   Contact information:   [address] [place] [address] [phone]      Discharge Diagnoses:  1. Gross hematuria with hemorrhagic cystitis 2. Obstructive uropathy secondary to gross hematuria 3. Acute kidney injury with hyperkalemia, hyponatremia 4. Acute blood loss anemia secondary to gross hematuria 5. Diabetes mellitus type 2  Discharge Condition: improved Disposition: home  Diet recommendation: carb-modified  History of present illness:  74 year old man with history of gross hematuria, chronic cystitis, bilateral vesicoureteral reflux, followed by urology as an outpatient who presented to the emergency department for the third time with difficulty with his Foley catheter. Evaluation revealed clogged Foley catheter which was  relieved with irrigation. Gross hematuria noted with clots. Case was discussed with urology who recommended continuous bladder irrigation and admission. Admitted for further evaluation of the above as well as acute kidney injury, hyperkalemia, acute blood loss anemia.  Hospital Course:  Scott Nixon was admitted for urology consultation. Hematuria rapidly resolved and continuous bladder irrigation was discontinued. Urology recommended low-dose ciprofloxacin and prednisone. Foley catheter draining well and he was cleared by urology for discharge. Acute kidney injury improved with resolution of obstruction in kidney function appears to be near baseline. Hyperkalemia resolved with treatment. Anemia stabilized. Did not require blood products. Metformin was discontinued given his creatinine.  1. Gross hematuria, hemorrhagic cystitis. Per urology recommendations, continue steroids for total 2 weeks as well as ciprofloxacin daily for total 2 weeks. 2. Obstructive uropathy secondary to gross hematuria. Resolved with continuous bladder irrigation. Foley draining clear urine. 3. Acute kidney injury with hyperkalemia, hyponatremia. Secondary to obstructive uropathy. Renal function close to baseline. 4. Hyperkalemia. Resolved, secondary to acute renal failure. 5. Acute blood loss anemia secondary to gross hematuria. Stable. Bleeding resolved. 6. Diabetes mellitus type 2, stable.   Has follow-up with urology in AM.  Discharge Instructions  Discharge Instructions    Activity as tolerated - No restrictions    Complete by:  As directed      Diet Carb Modified    Complete by:  As directed      Discharge instructions    Complete by:  As directed   Call your physician or seek immediate medical attention for bleeding, pain, blocked catheter or worsening of condition.          Current Discharge Medication List    START taking these medications   Details  ciprofloxacin (CIPRO) 250 MG tablet Take 1 tablet  (250 mg total) by mouth daily. Qty: 14 tablet, Refills: 0    predniSONE (DELTASONE) 10 MG tablet Take 40 mg by mouth daily for 4 days, then take 20 mg by mouth daily for 4 days, then take 10 mg by mouth daily for 4 days, then stop. Qty: 28 tablet, Refills: 0      CONTINUE these medications which have NOT CHANGED   Details  acetaminophen (TYLENOL) 500 MG tablet Take 500 mg by mouth every 6 (six) hours as needed for moderate pain or headache.    Cholecalciferol (VITAMIN D PO) Take 1,000 Units by mouth daily.     cyanocobalamin 500 MCG tablet Take 500 mcg by mouth 2 (two) times a week.     esomeprazole (NEXIUM) 40 MG packet Take 40 mg by mouth daily as needed (Acid reflux).     ezetimibe (ZETIA) 10 MG tablet Take 10 mg by mouth daily.     HYDROcodone-acetaminophen (NORCO/VICODIN) 5-325 MG per tablet Take 1 tablet by mouth every 4 (four) hours as needed for moderate pain.     hyoscyamine (LEVSIN/SL) 0.125 MG SL tablet Place 1 tablet (0.125 mg total) under the tongue every 4 (four) hours as needed for cramping (bladder pain). Qty: 15 tablet, Refills: 0    IRON PO Take 1 tablet by mouth 2 (two) times a week.     loratadine (CLARITIN) 10 MG tablet Take 10 mg by mouth every other day. As needed for allergies.    Omega-3 Fatty Acids (FISH OIL) 1200 MG CAPS Take 1,200 mg by mouth once a week.     Polyvinyl Alcohol-Povidone (REFRESH OP) Place 1 drop into both eyes daily as needed (dry eyes).    Psyllium (METAMUCIL PO) Take by mouth daily. 2 Teaspoonful    solifenacin (VESICARE) 5 MG tablet Take 5 mg by mouth every evening.    tamsulosin (FLOMAX) 0.4 MG CAPS capsule Take 0.4 mg by mouth 2 (two) times daily.     traMADol (ULTRAM) 50 MG tablet Take 50 mg by mouth every 6 (six) hours as needed for moderate pain.  Refills: 0    vitamin C (ASCORBIC ACID) 500 MG tablet Take 500 mg by mouth 2 (two) times daily.       STOP taking these medications     aspirin EC 81 MG tablet      atenolol  (TENORMIN) 25 MG tablet      metFORMIN (GLUCOPHAGE) 500 MG tablet      methylPREDNISolone (MEDROL DOSEPAK) 4 MG TBPK tablet      phenazopyridine (PYRIDIUM) 200 MG tablet      valsartan (DIOVAN) 40 MG tablet        Allergies  Allergen Reactions  . Other     Unknown medication that caused severe itching and scaling of hands and feet --  During admission Dec 2015    The results of significant diagnostics from this hospitalization (including imaging, microbiology, ancillary and laboratory) are listed below for reference.    Significant Diagnostic Studies: No results found.  Microbiology: Recent Results (from the past 240 hour(s))  Urine culture     Status: None   Collection Time: 08/04/14  9:30 AM  Result Value Ref Range Status   Specimen Description URINE, CLEAN CATCH  Final   Special Requests NONE  Final   Colony Count   Final    4,000 COLONIES/ML Performed at [facility]    Culture   Final  INSIGNIFICANT GROWTH Performed at [facility]    Report Status 08/05/2014 FINAL  Final     Labs: Basic Metabolic Panel:  Recent Labs Lab 08/04/14 0831 08/05/14 0355 08/06/14 0424  NA 124* 133* 137  K 5.2* 5.5* 4.6  CL 102 109 109  CO2 16* 17* 19*  GLUCOSE 151* 184* 126*  BUN 55* 48* 52*  CREATININE 2.22* 1.71* 1.79*  CALCIUM 8.2* 8.8* 8.9    CBC:  Recent Labs Lab 08/04/14 0831 08/04/14 1641 08/05/14 0355  WBC 10.8* 9.2 6.9  NEUTROABS 9.1*  --   --   HGB 9.9* 9.8* 9.5*  HCT 30.7* 30.4* 30.0*  MCV 83.9 84.7 84.3  PLT 204 197 193     CBG:  Recent Labs Lab 08/05/14 0727 08/05/14 1425 08/05/14 1937 08/06/14 0132 08/06/14 0717  GLUCAP 126* 201* 205* 128* 98    Principal Problem:   Gross hematuria Active Problems:   Diabetes mellitus, type 2   AKI (acute kidney injury)   Obstructive uropathy   Hyperkalemia   Hemorrhagic cystitis   Time coordinating discharge: 35 minutes  Signed:  Brendia Sacks, Nixon [facility] 08/06/2014, 11:33 AM

## 2014-09-20 ENCOUNTER — Other Ambulatory Visit: Payer: Self-pay

## 2014-09-20 DIAGNOSIS — G8929 Other chronic pain: Secondary | ICD-10-CM

## 2014-09-20 DIAGNOSIS — R1031 Right lower quadrant pain: Secondary | ICD-10-CM

## 2014-09-25 ENCOUNTER — Ambulatory Visit
Admission: RE | Admit: 2014-09-25 | Discharge: 2014-09-25 | Disposition: A | Discharge status: Home / Self Care | Payer: Medicare Other | Source: Ambulatory Visit | Attending: Surgery | Admitting: Surgery

## 2014-10-30 ENCOUNTER — Other Ambulatory Visit: Payer: Self-pay | Admitting: Surgery

## 2014-10-30 NOTE — H&P (Signed)
Scott Nixon. Whitby 10/30/2014 3:43 PM Location: [facility] Patient #: [id number] DOB: 09/16/1940 Married / Language: [place] / Race: White Male History of Present Illness Scott Sportsman MD; 10/30/2014 4:37 PM) Patient words: hems.  The patient is a 74 year old male who presents with colovesical fistula. Patient returns status post colon repair of colovesical fistula along with appendectomy for early mucocele 03/23/2014.  Patient comes today with his wife. The swelling in his incision opened up and drained. No pus. Seem like postoperative seroma. They packed it for a few days and then became superficial. Chest using a Band-Aid. That is better. He had seen urology. Had cystoscopy in the office. Concern for inflammation at the base of the bladder but not at the dome. CAT scan showed no evidence of recurrent fistula or abscess or leak. Inflamed bladder. Irritated RIGHT renal system. LEFT inguinal hernia with fluid at the scrotum.   Patient had hematuria. Urinary issues. Culture-negative. Extensive workup. Negative biopsies on the bladder. Now on steroids. Hematuria resolved since late May. Feeling better but still having urinary frequency. Urinating every hour or so. Flaxseed bowel regimen seems to working best for him. Usually has one good bowel movement a day. Eating pretty well. Weight more stable.  He still feels a lump in his RIGHT lower quadrant near the anterior axillary line. No major change. CT scan suspicious for hernia in suprapubic paramedian region radiating out to the lateral side. No nausea or vomiting. No pain on his lower incision. Energy level okay. Color coming back.                                   03/23/2014  12:44 PM  PATIENT: Scott Nixon 74 y.o. 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 Appendix, Other than Incidental - LOW GRADE APPENDICEAL MUCINOUS NEOPLASM - (Benign mucocele) - PROXIMAL MARGIN IS NEGATIVE. - SEE COMMENT. 2. Colon, resection margin (donut), distal sigmoid colon ring - BENIGN COLORECTAL MUCOSA. - NO EVIDENCE OF SIGNIFICANT INFLAMMATION, DYSPLASIA OR MALIGNANCY. 3. Colon, segmental resection - FINDINGS CONSISTENT WITH DIVERTICULITIS. - INFLAMMATORY PROCESS EXTENDS TO INVOLVE UNDERLYING ADIPOSE TISSUE - NO ATYPIA OR MALIGNANCY IDENTIFIED. Microscopic Comment 1. 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 The proximal margin is negative. There is no evidence of invasion by mucin or epithelium. There is no extra-appendiceal mucin or neoplastic epithelium. No Scott Nixon perforation is identified. Dr. Frederica Nixon has seen the appendix in consultation with agreement of the above diagnosis and comment. Scott Abts MD Pathologist, Electronic Signature (Case signed 03/27/2014)                              CLINICAL DATA: Right lower quadrant pain. Palpable abnormality in the right lower quadrant.  Episodes of Scott Nixon hematuria with blood clots in May. Urinary frequency. Left hydrocele. Prior surgery 12/15 for diverticulitis and fistula repair. Prior appendectomy. History of abscess with drain from diverticulitis in 2012. EXAM:  CT ABDOMEN AND PELVIS WITHOUT CONTRAST TECHNIQUE: Multidetector CT imaging of the abdomen and pelvis was performed following the standard protocol without IV contrast. COMPARISON: 06/22/2014 FINDINGS: Lower chest: Bibasilar atelectasis. Calcified granuloma at the anterior left lung base. Mild cardiomegaly, without pleural fluid. Trace anterior pericardial fluid or thickening. Multivessel coronary artery atherosclerosis. Hepatobiliary: Caudate lobe cyst. Cholelithiasis, with a calcified stone measuring 1.7 cm on image 29. No biliary duct dilatation or evidence of acute cholecystitis. Pancreas: Normal, without mass or ductal dilatation. A periampullary duodenal diverticulum is incidentally noted. Spleen: Normal Adrenals/Urinary Tract: Normal adrenal glands. Mild bilateral renal cortical thinning. Punctate left-sided renal collecting system calculi. Right renal and perinephric edema is similar. Right-sided pelvic caliectasis and hydroureter is mild and similar. Periureteric edema persists. Right hydroureter is followed to the level of the urinary bladder. No obstructive stone or mass identified. The bladder wall is moderately thickened, decreased. Surrounding pericystic edema is mild to moderate. No fistulous communication to bowel identified. Stomach/Bowel: Normal stomach, without wall thickening. Surgical changes within the rectum. No contrast extravasation. Colon and terminal ileum otherwise unremarkable. Normal small bowel. Vascular/Lymphatic: Aortic and branch vessel atherosclerosis. No abdominopelvic adenopathy. Reproductive: Normal prostate. Other: Bilateral fat containing inguinal hernias. The palpable abnormality corresponds to a lateral right pelvic wall fat containing hernia on image 71. This is enlarged since the prior. No significant free fluid. Musculoskeletal: Degenerative disc disease at L4-5 and L5-S1. Prominent posterior osteophytes a L1-2. IMPRESSION: 1. The palpable  abnormality is due to an enlarging fat containing lateral right pelvic wall hernia. 2. Improved but persistent or recurrent bladder wall thickening and surrounding edema, consistent with cystitis. No specific evidence of fistulous communication to bowel. 3. Bilateral renal cortical thinning with similar right-sided pelves caliectasis and hydroureter. This is most likely related to ascending infection. No obstructive stone or mass identified. 4. Left nephrolithiasis. 5. Cholelithiasis. Electronically Signed By: Jeronimo Greaves M.D. On: 09/25/2014 14:54   Result Notes Notes Recorded by Scott Soda, MD on 10/03/2014 at 5:44 PM CT scan notes RIGHT lower quadrant abdominal wall hernia. Looks more like Has a defect in the posterior rectus fascia but I don't know if the anterior rectus fascia is breech. Could be a high riding direct hernia that is herniating superiorly as well as inferiorly. Findings noted with patient and his wife on the phone. He still has a lot of urinary frequency but no more hematuria on the immunosupression. I recommend he consider surgical repair of the hernias. Probably would do a laparoscopic preperitoneal approach with mesh/combined approach. However, I would like to wait until he has no more urinary infections for at least 3 months. Would like to see his bladder issues under better control. He would like to think about things and let me know. He would like to discuss it when he comes back to th office again. A new care with his urologist to sort out his persistent cystitis.    Vitals Height Weight BMI (Calculated)   Interpretation Summary CLINICAL DATA: Right lower quadrant pain. Palpable abnormality in the right lower quadrant. Episodes of Atalie Oros hematuria with blood clots in May. Urinary frequency. Left hydrocele. Prior surgery 12/15 for diverticulitis and fistula repair. Prior appendectomy. History of abscess with drain from diverticulitis in 2012. EXAM: CT  ABDOMEN AND PELVIS WITHOUT CONTRAST TECHNIQUE: Multidetector CT imaging  of the abdomen and pelvis was performed following the standard protocol without IV contrast. COMPARISON: 06/22/2014 FINDINGS: Lower chest: Bibasilar atelectasis. Calcified granuloma at the anterior left lung base. Mild cardiomegaly, without pleural fluid. Trace anterior pericardial fluid or thickening. Multivessel coronary artery atherosclerosis. Hepatobiliary: Caudate lobe cyst. Cholelithiasis, with a calcified stone measuring 1.7 cm on image 29. No biliary duct dilatation or evidence of acute cholecystitis. Pancreas: Normal, without mass or ductal dilatation. A periampullary duodenal diverticulum is incidentally noted. Spleen: Normal Adrenals/Urinary Tract: Normal adrenal glands. Mild bilateral renal cortical thinning. Punctate left-sided renal collecting system calculi. Right renal and perinephric edema is similar. Right-sided pelvic caliectasis and hydroureter is mild and similar. Periureteric edema persists. Right hydroureter is followed to the level of the urinary bladder. No obstructive stone or mass identified. The bladder wall is moderately thickened, decreased. Surrounding pericystic edema is mild to moderate. No fistulous communication to bowel identified. Stomach/Bowel: Normal stomach, without wall thickening. Surgical changes within the rectum. No contrast extravasation. Colon and terminal ileum otherwise unremarkable. Normal small bowel. Vascular/Lymphatic: Aortic and branch vessel atherosclerosis. No abdominopelvic adenopathy. Reproductive: Normal prostate. Other: Bilateral fat containing inguinal hernias. The palpable abnormality corresponds to a lateral right pelvic wall fat containing hernia on image 71. This is enlarged since the prior. No significant free fluid. Musculoskeletal: Degenerative disc disease at L4-5 and L5-S1. Prominent posterior osteophytes a L1-2. IMPRESSION: 1. The palpable  abnormality is due to an enlarging fat containing lateral right pelvic wall hernia. 2. Improved but persistent or recurrent bladder wall thickening and surrounding edema, consistent with cystitis. No specific evidence of fistulous communication to bowel. 3. Bilateral renal cortical thinning with similar right-sided pelves caliectasis and hydroureter. This is most likely related to ascending infection. No obstructive stone or mass identified. 4. Left nephrolithiasis. 5. Cholelithiasis. Electronically Signed By: Jeronimo Greaves M.D. On: 09/25/2014 14:54   External Result Report External Result Report <[email or web address]>  Imaging Imaging Information <[email or web address]>  Signed by Signed Date/Time Phone Pager Jeronimo Greaves 09/25/2014 2:54 PM [phone]  Exam Information Status Exam Begun Exam Ended Final [99] 09/25/2014 1:13 PM 09/25/2014 2:16 PM  Result Notes Notes Recorded by Scott Soda, MD on 10/03/2014 at 5:44 PM CT scan notes RIGHT lower quadrant abdominal wall hernia. Looks more like Has a defect in the posterior rectus fascia but I don't know if the anterior rectus fascia is breech. Could be a high riding direct hernia that is herniating superiorly as well as inferiorly. Findings noted with patient and his wife on the phone. He still has a lot of urinary frequency but no more hematuria on the immunosupression. I recommend he consider surgical repair of the hernias. Probably would do a laparoscopic preperitoneal approach with mesh/combined approach. However, I would like to wait until he has no more urinary infections for at least 3 months. Would like to see his bladder issues under better control. He would like to think about things and let me know. He would like to discuss it when he comes back to th office again. A new care with his urologist to sort out his persistent cystitis.  Signed Electronically signed by Jeronimo Greaves, MD on 09/25/14 at 1454  EDT                 CLINICAL DATA: History of colovesical fistula status post repair. Ongoing bladder inflammation.  EXAM: CT ABDOMEN AND PELVIS WITH CONTRAST  TECHNIQUE: Multidetector CT imaging of the abdomen and pelvis was performed using the standard protocol following bolus administration of  intravenous contrast.  CONTRAST: 90 cc of Isovue-300  COMPARISON: 01/27/2014  FINDINGS: Lower chest: There is no pleural or pericardial effusion identified. Right infrahilar lymph node measures 8 mm, image number 1/series 2. Calcification noted within the RCA Coronary artery. There is no pleural effusion. Lung bases appear clear.  Hepatobiliary: Cyst within the caudate lobe of liver is unchanged. There is a stone within the gallbladder measuring 1.6 cm, image 28/series 2. No biliary dilatation.  Pancreas: Negative  Spleen: Negative  Adrenals/Urinary Tract: The adrenal glands are both normal. Right renal calculus measures 5 mm, image 39/ series 2. Unchanged from previous exam. Bilateral and symmetric perinephric fat stranding appear similar to previous exam. There is right-sided pelviectasis with mucosal enhancement and thickening involving the right ureter. Normal appearance of the left ureter. The bladder is partially collapsed. There is diffuse bladder wall thickening with surrounding fat stranding. No focal filling defects identified within the urinary bladder. There is no gas identified within the bladder. Although the pelvic bowel loops appeared rest upon the dome of bladder there are no specific features identified to suggest recurrent colovesical or enterovesical fistula.  Stomach/Bowel: The stomach appears normal. The small bowel loops have a normal caliber without obstruction. Postoperative changes involving the proximal ascending colon identified. Partial colectomy involving the sigmoid colon noted. Suture line appears intact, image 70/series 2. No  evidence for bowel obstruction.  Vascular/Lymphatic: Calcified atherosclerotic disease involves the abdominal aorta. No aneurysm. Aortocaval lymph node measures 12 mm, image 41/series 2. Unchanged from previous exam. There is a right common iliac node which measures 8 mm, image 57/series 2. Unchanged from previous exam.  Reproductive: There is a large left hydrocele. Within the distal left inguinal canal just above the left testis there is a peripherally enhancing well-circumscribed low-attenuation structure which measures 3.9 x 3.3 cm. There are bilateral inguinal hernias, left greater than right.  Other: No free fluid or fluid collections identified within the abdomen or within the pelvis.  Musculoskeletal: No aggressive lytic or sclerotic bone lesions identified. Spondylosis noted within the lumbar spine.  IMPRESSION: 1. Postoperative changes compatible with partial colectomy and surgical resection of colovesical fistula. 2. Bladder wall thickening and enhancement is identified compatible with cystitis. No gas or debris is identified within the lumen of the bladder to suggest recurrence of fistula. 3. Right-sided pelvocaliectasis with mucosal thickening and enhancement of the right renal collecting system suggesting ascending inflammation/infection. 4. Large left inguinal hernia. Within the distal left inguinal canal there is a peripherally enhancing low-attenuation structure which is concerning for either abscess, hematoma or infected hematoma. This corresponds with the ultrasound findings from 05/05/2014. 5. Atherosclerotic disease. At least single vessel Coronary artery calcification is present. 6. Prominent retroperitoneal and iliac lymph nodes are identified which may be reactive in etiology. 7. Gallstone.   Electronically Signed By: Signa Kell M.D. 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 REPAIR, FISTULA, COLOVESICAL ([id number]) 03/23/2014 03/23/2014 12:44 PM PATIENT: Scott Nixon 74 y.o. 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 MetFORMIN HCl (500MG  Tablet, Oral) Active. Valsartan (320MG  Tablet, Oral) Active. Tamsulosin HCl (0.4MG  Capsule, Oral) Active. MetroNIDAZOLE (500MG  Tablet, Oral) Active. Zetia (10MG  Tablet, Oral) Active. Aspirin EC (81MG  Tablet DR, Oral) Active. Tylenol Extra Strength (500MG  Tablet, Oral) Active. Aleve (220MG  Capsule, Oral) Active. Diovan (40MG  Tablet, Oral) Active. Medications  Reconciled  Social History Scott Sportsman, MD; 10/30/2014 3:49 PM) Alcohol use Occasional alcohol use. Caffeine use Coffee. No drug use Tobacco use Never smoker.  Family History Scott Sportsman, MD; 10/30/2014 3:49 PM) Alcohol Abuse Father. Diabetes Mellitus Family Members In General. Hypertension Family Members In General. Respiratory Condition Family Members In General, Mother.    Vitals (Sonya Bynum CMA; 10/30/2014 3:44 PM) 10/30/2014 3:43 PM Weight: 221.4 lb Height: 70in Body Surface Area: 2.23 m Body Mass Index: 31.77 kg/m Temp.: 35F(Temporal)  Pulse: 78 (Regular)  BP: 134/70 (Sitting, Left Arm, Standard)     Physical Exam Scott Sportsman MD; 10/30/2014 4:37 PM)  General Note: Alert. Pleasant. No distress. Good color. Joking.   Integumentary Global Assessment Normal Exam - Distribution of scalp and body hair is normal. General Characteristics Overall Skin Surface - no rashes and no suspicious lesions.  Head and Neck Head-normocephalic, atraumatic with no lesions or palpable masses. Face Global Assessment - atraumatic, no absence of expression. Neck Global Assessment - no abnormal movements, no decreased range of motion. Trachea-midline. Thyroid Gland Characteristics - non-tender.  Eye Eyeball - Left-Extraocular movements intact, No Nystagmus. Eyeball - Right-Extraocular movements intact, No Nystagmus. Upper Eyelid - Left-No Cyanotic. Upper Eyelid - Right-No Cyanotic.  Chest and Lung Exam Inspection Accessory muscles - No use of accessory muscles in breathing.  Abdomen Note: Abdomen soft and nontender. Pfannenstiel incision closed lower abdomen. Mild fullness RIGHT lower quadrant lateral clavicular line.   Male Genitourinary Note: Patient with BIH. More mobile. No more edema. Some LEFT scrotal swelling but not severe. Most likely hydrocele.   Peripheral Vascular Upper Extremity Inspection - Left - Not  Gangrenous, No Petechiae. Right - Not Gangrenous, No Petechiae.  Neurologic Neurologic evaluation reveals -normal attention span and ability to concentrate, able to name objects and repeat phrases. Appropriate fund of knowledge and normal coordination.  Neuropsychiatric Mental status exam performed with findings of-able to articulate well with normal speech/language, rate, volume and coherence and no evidence of hallucinations, delusions, obsessions or homicidal/suicidal ideation. Orientation-oriented X3. Note: Smiling and joking.   Musculoskeletal Global Assessment Gait and Station - normal gait and station.  Lymphatic General Lymphatics Description - No Generalized lymphadenopathy.    Assessment & Plan Scott Sportsman MD; 10/30/2014 4:11 PM)  COLOVESICAL FISTULA (596.1  N32.1) Impression: I do not see any strong evidence for recurrent fistula.  Mild not in RIGHT lower quadrant but no definite hernia felt. Reasonable to repeat CAT scan to make sure that there is not one forming ? RLQ port site. Rule out RIGHT lateral incisional hernia although  seems away from that. Oral and rectal contrast. 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 Doubt recurrence there. Colonoscopy didn't show any surprises either. Can revisit that.  Defer urinary frequency to his urologist. The fact that the hematuria resolves is a hopeful sign  Continue to diabetic control.  Changed to different fiber supplement to see if his bowels get moving more regularly.  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 Scrotal swelling going down. Would leave  it alone for now. He could benefit from surgery to get that fixed but I would wait. Make sure his pyuria and cystitis is resolved first. I do not want to operate the middle of that. Did not think that that is really an issue right now. Not symptomatic there at all.  Current Plans Schedule for Surgery Written instructions provided The anatomy & physiology of the abdominal wall was discussed. The pathophysiology of hernias was discussed. Natural history risks without surgery including progeressive enlargement, pain, incarceration, & strangulation was discussed. Contributors to complications such as smoking, obesity, diabetes, prior surgery, etc were discussed.  I feel the risks of no intervention will lead to serious problems that outweigh the operative risks; therefore, I recommended surgery to reduce and repair the hernia. I explained laparoscopic techniques with possible need for an open approach. I noted the probable use of mesh to patch and/or buttress the hernia repair  Risks such as bleeding, infection, abscess, need for further treatment, heart attack, death, and other risks were discussed. I noted a good likelihood this will help address the problem. Goals of post-operative recovery were discussed as well. Possibility that this will not correct all symptoms was explained. I stressed the importance of low-impact activity, aggressive pain control, avoiding constipation, & not pushing through pain to minimize risk of post-operative chronic pain or injury. Possibility of reherniation especially with smoking, obesity, diabetes, immunosuppression, and other health conditions was discussed. We will work to minimize complications.  An educational handout further explaining the pathology & treatment options was given as well. Questions were answered. The patient expresses understanding & wishes to proceed with surgery. Discussed regular exercise with patient. Pt Education - [facility] Hernia Post-Op HCI (Nakia Remmers):  discussed with patient and provided information. Pt Education - Pamphlet Given - Laparoscopic Hernia Repair: discussed with patient and provided information.  Scott Nixon, M.D., F.A.C.S. Gastrointestinal and Minimally Invasive Surgery [facility] [address] [place], [place] [address] [phone] Main / Paging

## 2014-11-03 ENCOUNTER — Encounter: Payer: Self-pay | Admitting: Podiatry

## 2014-11-03 ENCOUNTER — Ambulatory Visit (INDEPENDENT_AMBULATORY_CARE_PROVIDER_SITE_OTHER): Payer: Medicare Other

## 2014-11-03 ENCOUNTER — Ambulatory Visit (INDEPENDENT_AMBULATORY_CARE_PROVIDER_SITE_OTHER): Payer: Medicare Other | Admitting: Podiatry

## 2014-11-03 VITALS — BP 112/66 | HR 82 | Resp 14

## 2014-11-03 DIAGNOSIS — M79672 Pain in left foot: Secondary | ICD-10-CM

## 2014-11-03 DIAGNOSIS — M7662 Achilles tendinitis, left leg: Secondary | ICD-10-CM

## 2014-11-03 MED ORDER — TRIAMCINOLONE ACETONIDE 10 MG/ML IJ SUSP
10.0000 mg | Freq: Once | INTRAMUSCULAR | Status: AC
  Administered 2014-11-03: 10 mg

## 2014-11-03 NOTE — Progress Notes (Signed)
   Subjective:    Patient ID: Scott Nixon, male    DOB: [birth year], 74 y.o.   MRN: [id number]  HPI  Patient is here today with left back of heel pain, since 1 week ago. He is currently taking tylenol every 4 hours for pain which helps.  Review of Systems  Genitourinary: Positive for urgency.       Objective:   Physical Exam        Assessment & Plan:

## 2014-11-06 NOTE — Progress Notes (Signed)
Subjective:     Patient ID: Scott Nixon, male   DOB: [birth year], 74 y.o.   MRN: [id number]  HPI patient states she's getting a lot of pain in the back of his left heel and it's been present for a little while and is worsened over the last couple weeks.   Review of Systems     Objective:   Physical Exam Neurovascular status intact muscle strength adequate with exquisite discomfort posterior lateral aspect left heel at the insertional point tendon the calcaneus with inflammation and fluid at its insertion. Patient has no pain on the medial side and minimal discomfort in the central portion    Assessment:     Acute Achilles tendinitis left lateral side with inflammation and fluid buildup noted    Plan:     H&P and x-ray removed. Injected the lateral side of the tendon after explaining risk of rupture to patient with 3 mg dexamethasone Kenalog 5 mg Xylocaine and applied ice and air fracture walker with instructions for complete immobilization

## 2014-11-24 ENCOUNTER — Ambulatory Visit: Payer: Medicare Other | Admitting: Podiatry

## 2014-12-25 ENCOUNTER — Encounter: Payer: Self-pay | Admitting: Neurology

## 2014-12-25 ENCOUNTER — Ambulatory Visit (INDEPENDENT_AMBULATORY_CARE_PROVIDER_SITE_OTHER): Payer: Medicare Other | Admitting: Neurology

## 2014-12-25 VITALS — BP 110/74 | HR 76 | Resp 16 | Wt 221.0 lb

## 2014-12-25 DIAGNOSIS — E1122 Type 2 diabetes mellitus with diabetic chronic kidney disease: Secondary | ICD-10-CM

## 2014-12-25 DIAGNOSIS — G629 Polyneuropathy, unspecified: Secondary | ICD-10-CM | POA: Diagnosis not present

## 2014-12-25 DIAGNOSIS — G250 Essential tremor: Secondary | ICD-10-CM

## 2014-12-25 DIAGNOSIS — E1342 Other specified diabetes mellitus with diabetic polyneuropathy: Secondary | ICD-10-CM | POA: Diagnosis not present

## 2014-12-25 DIAGNOSIS — N183 Chronic kidney disease, stage 3 unspecified: Secondary | ICD-10-CM

## 2014-12-25 DIAGNOSIS — I129 Hypertensive chronic kidney disease with stage 1 through stage 4 chronic kidney disease, or unspecified chronic kidney disease: Secondary | ICD-10-CM

## 2014-12-25 DIAGNOSIS — E1142 Type 2 diabetes mellitus with diabetic polyneuropathy: Secondary | ICD-10-CM

## 2014-12-25 MED ORDER — PROPRANOLOL HCL ER 60 MG PO CP24: 60.0000 mg | ORAL_CAPSULE | Freq: Every day | ORAL | Status: DC

## 2014-12-25 NOTE — Progress Notes (Signed)
Subjective:   Scott Nixon was seen in consultation in the movement disorder clinic at the request of Ginette Otto, MD.  The evaluation is for tremor.  The records that were made available to me were reviewed.  The patient is accompanied by his "significant other" of 19 years who supplements the history.   The patient is a 74 y.o. right handed male with a history of tremor.     Tremor has been present for "my whole life" but it got worse after an operation for diverticultitis and then he had a bladder fistula.  Both hands are involved but the right is worse than the the left. Pt was given primidone about a year and a half ago for the treatment of tremor (? Dose) but was not able to tolerate it due to poor energy.  He took it for about 6 months.  He has taken no other RX medications.    He has no hx of asthma, COPD or significant depression.  He does have a hx of nephrolithiasis.  He does have some tremor at rest but it is worse with use.  There is a family hx of tremor in his dad but only because he had PD and otherwise no hx of ET.    Affected by caffeine:  No. (has greatly decreased caffeine) Affected by alcohol:  Doesn't notice it Affected by stress:  Yes.   Affected by fatigue: unknown Spills soup if on spoon:  Yes.   Spills glass of liquid if full:  Yes.   Affects ADL's (tying shoes, brushing teeth, etc):  No., but does affect ability to shave some  Denies any balance issues.  Denies falls.  Admits to cramping.  Feels weak all over and states that not as "swift" as used to be.  Admits to acting out the dreams - fighting all night. Some constipation but not huge issue.  No swallowing issues.  No diplopia.    Current/Previously tried tremor medications: primidone (SE); hx of nephrolithiasis so unable to take topamax  Current medications that may exacerbate tremor:  Prednisone (only 5 mg per day)  Outside reports reviewed: historical medical records.  Allergies  Allergen  Reactions  . Other     Unknown medication that caused severe itching and scaling of hands and feet --  During admission Dec 2015    Outpatient Encounter Prescriptions as of 12/25/2014  Medication Sig  . acetaminophen (TYLENOL) 500 MG tablet Take 500 mg by mouth every 6 (six) hours as needed for moderate pain or headache.  . Cholecalciferol (VITAMIN D PO) Take 1,000 Units by mouth daily.   . ciprofloxacin (CIPRO) 250 MG tablet Take 1 tablet (250 mg total) by mouth daily.  . cyanocobalamin 500 MCG tablet Take 500 mcg by mouth 2 (two) times a week.   . esomeprazole (NEXIUM) 40 MG packet Take 40 mg by mouth daily as needed (Acid reflux).   Marland Kitchen ezetimibe (ZETIA) 10 MG tablet Take 10 mg by mouth daily.   Marland Kitchen HYDROcodone-acetaminophen (NORCO/VICODIN) 5-325 MG per tablet Take 1 tablet by mouth every 4 (four) hours as needed for moderate pain.   . hyoscyamine (LEVSIN/SL) 0.125 MG SL tablet Place 1 tablet (0.125 mg total) under the tongue every 4 (four) hours as needed for cramping (bladder pain).  . IRON PO Take 1 tablet by mouth 2 (two) times a week.   . loratadine (CLARITIN) 10 MG tablet Take 10 mg by mouth every other day. As needed for allergies.  Marland Kitchen  Omega-3 Fatty Acids (FISH OIL) 1200 MG CAPS Take 1,200 mg by mouth once a week.   . Polyvinyl Alcohol-Povidone (REFRESH OP) Place 1 drop into both eyes daily as needed (dry eyes).  . predniSONE (DELTASONE) 5 MG tablet Take 5 mg by mouth daily.  . Psyllium (METAMUCIL PO) Take by mouth daily. 2 Teaspoonful  . solifenacin (VESICARE) 5 MG tablet Take 5 mg by mouth every evening.  . tamsulosin (FLOMAX) 0.4 MG CAPS capsule Take 0.4 mg by mouth 2 (two) times daily.   . TRADJENTA 5 MG TABS tablet   . traMADol (ULTRAM) 50 MG tablet Take 50 mg by mouth every 6 (six) hours as needed for moderate pain.   . vitamin C (ASCORBIC ACID) 500 MG tablet Take 500 mg by mouth 2 (two) times daily.    No facility-administered encounter medications on file as of 12/25/2014.     Past Medical History  Diagnosis Date  . GERD (gastroesophageal reflux disease)   . HTN (hypertension)   . Nephrolithiasis   . Chronic cystitis   . Headache   . Anemia   . Right bundle branch block (RBBB)   . Benign essential tremor   . Type 2 diabetes mellitus   . Hyperlipidemia   . Diverticulosis of colon   . History of diverticulitis of colon     adx 07-06-2009 w/ abscess  . Gross hematuria   . CVF (colovesical fistula)     repair 03-23-2014  . History of gout   . Lower urinary tract symptoms (LUTS)   . Dysuria   . Itching     back, arms,  hand,  feet--  chronic itching since surgery in Dec 2015  . At risk for sleep apnea     STOP-BANG= 5       SENT TO PCP 07-20-2014    Past Surgical History  Procedure Laterality Date  . Rotator cuff repair Left 2005  . Arm surgery Left 1988    torn ligament  . Colonoscopy N/A 03/22/2014    Procedure: COLONOSCOPY;  Surgeon: Louis Meckel, MD;  Location: [facility];  Service: Endoscopy;  Laterality: N/A;  . Appendectomy N/A 03/23/2014    Procedure: APPENDECTOMY;  Surgeon: Karie Soda, MD;  Location: [facility];  Service: General;  Laterality: N/A;  . Proctoscopy N/A 03/23/2014    Procedure: RIGID PROCTOSCOPY;  Surgeon: Karie Soda, MD;  Location: [facility];  Service: General;  Laterality: N/A;  . Laparoscopic partial colectomy N/A 03/23/2014    Procedure: LAPAROSCOPIC LEFT  COLECTOMY, SPLENIC FLEXURE MOBILIZATION,  APPENDECTOMY, REPAIR BLADDER, COLOVESICAL FISTULA;  Surgeon: Karie Soda, MD;  Location: [facility];  Service: General;  Laterality: N/A;  . Cystoscopy with stent placement Bilateral 03/23/2014    Procedure: CYSTOSCOPY WITH BILATERAL URETERAL CATHETER  PLACEMENT;  Surgeon: Valetta Fuller, MD;  Location: [facility];  Service: Urology;  Laterality: Bilateral;  . 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 Eye surgery Right 1963    eyelid  . Inguinal hernia repair Right 1950  . Cystoscopy with biopsy N/A 07/24/2014    Procedure:  CYSTOSCOPY WITH BIOPSY;  Surgeon: Barron Alvine, MD;  Location: [facility];  Service: Urology;  Laterality: N/A;  . Cystoscopy w/ retrogrades Bilateral 07/24/2014    Procedure: CYSTOSCOPY WITH RETROGRADE PYELOGRAM;  Surgeon: Barron Alvine, MD;  Location: [facility];  Service: Urology;  Laterality: Bilateral;  . Cystogram N/A 07/24/2014    Procedure: CYSTOGRAM;  Surgeon: Barron Alvine, MD;  Location:  [facility];  Service: Urology;  Laterality: N/A;    Social History   Social History  . Marital Status: Significant Other    Spouse Name: N/A  . Number of Children: 1  . Years of Education: N/A   Occupational History  . Owner     [employer]   Social History Main Topics  . Smoking status: Never Smoker   . Smokeless tobacco: Never Used  . Alcohol Use: No  . Drug Use: No  . Sexual Activity: Not on file   Other Topics Concern  . Not on file   Social History Narrative    Family Status  Relation Status Death Age  . Father Deceased     Unknown  . Mother Deceased     COPD    Review of Systems Admits to chronic "itching all over" for which he takes low dose prednisone.  No lateralizing weakness or paresthesias.   Admits to cramping in the hands and feet, especially at night but also if he does paperwork during the day.  States that he drinks plenty of water.   A complete 10 system ROS was obtained and was negative apart from what is mentioned.   Objective:   VITALS:   Filed Vitals:   12/25/14 0755  BP: 110/74  Pulse: 76  Resp: 16  Weight: 221 lb (100.245 kg)  SpO2: 97%   Gen:  Appears stated age and in NAD. HEENT:  Normocephalic, atraumatic. The mucous membranes are moist. The superficial temporal arteries are without ropiness or tenderness. Cardiovascular: Regular rate and rhythm. Lungs: Clear to auscultation bilaterally. Neck: There are no carotid bruits noted bilaterally.  NEUROLOGICAL:  Orientation:  The patient is  alert and oriented x 3.  Recent and remote memory are intact.  Attention span and concentration are normal.  Able to name objects and repeat without trouble.  Fund of knowledge is appropriate Cranial nerves: There is good facial symmetry. The pupils are equal round and reactive to light bilaterally. Fundoscopic exam is attempted but the disc margins are not well visualized bilaterally.   Extraocular muscles are intact and visual fields are full to confrontational testing. Speech is fluent and clear. Soft palate rises symmetrically and there is no tongue deviation. Hearing is intact to conversational tone. Tone: Tone is good throughout. Sensation: Sensation is intact to light touch and pinprick throughout (facial, trunk, extremities). Vibration is absent at the bilateral big toe and ankle. There is no extinction with double simultaneous stimulation. There is no sensory dermatomal level identified. Coordination:  The patient has no dysdiadichokinesia or dysmetria. Motor: Strength is 5/5 in the bilateral upper and lower extremities.  Shoulder shrug is equal bilaterally.  There is no pronator drift.  There are no fasciculations noted. DTR's: Deep tendon reflexes are 2+/4 at the bilateral biceps, triceps, brachioradialis, patella and absent at the bilateral achilles.  Plantar responses are downgoing bilaterally. Gait and Station: The patient is able to ambulate without difficulty. The patient has trouble with tandem gait.  He can walk on his heels/toes.  He can stand in the romberg position with eyes open/closed but does sway with eyes closed  MOVEMENT EXAM: Tremor:  There is significant tremor in the UE, noted most significantly with action.  The patient is not able to draw Archimedes spirals without significant difficulty.  There is mild tremor at rest in both hands.  The patient is not able to pour water from one glass to another without spilling it.  His  tremor becomes quite severe with pouring water.    LABS  Reviewed PCP labs.  HgbA1C was 6.6. Cr. Is 2.24.      Assessment/Plan:   1.  Essential Tremor.  -This is evidenced by the symmetrical nature and longstanding hx of gradually getting worse.  He does have a resting component now but that is not that unusual with longstanding disease.  His ET is moderate to moderately severe now.  Talked about medical and surgical treatments.  He has already tried and failed primidone.  Not candidate for topamax due to nephrolithiasis.   Will cautiously try propranolol LA 60 mg daily and have him monitor BP and pulse closely.  Probably will need to increase dose in future if he tolerates it.    2.  Gait instability  -This is multifactorial, due to diabetic peripheral neuropathy (significant) and essential tremor.  -It is believed that ET alone can cause ataxia/balance changes due to changes in the cerebellar purkinje cell/climbing fiber synaptic transmission so this could add to his balance issues  -discussed safety associated with diabetic PN  3.  Stage 3 kidney disease  -follows with urology but not nephrology  4.  Follow up is anticipated in the next few months, sooner should new neurologic issues arise.  Much greater than 50% of this visit was spent in counseling with the patient and the family.  Total face to face time:  60 min

## 2014-12-25 NOTE — Patient Instructions (Signed)
1.  Start inderal LA - 60 mg - once a day.  Monitor your blood pressure and pulse 2.  Make an appt to see me in 2-3 months as we may need to increase the medication if no side effects

## 2014-12-26 NOTE — Progress Notes (Signed)
Note routed to Dr Stoneking.  

## 2015-01-17 ENCOUNTER — Ambulatory Visit (INDEPENDENT_AMBULATORY_CARE_PROVIDER_SITE_OTHER): Payer: Medicare Other | Admitting: Physician Assistant

## 2015-01-17 ENCOUNTER — Encounter: Payer: Self-pay | Admitting: Physician Assistant

## 2015-01-17 ENCOUNTER — Other Ambulatory Visit (INDEPENDENT_AMBULATORY_CARE_PROVIDER_SITE_OTHER): Payer: Medicare Other

## 2015-01-17 VITALS — BP 120/70 | HR 80 | Temp 97.8°F | Ht 70.0 in | Wt 219.2 lb

## 2015-01-17 DIAGNOSIS — R197 Diarrhea, unspecified: Secondary | ICD-10-CM

## 2015-01-17 DIAGNOSIS — R05 Cough: Secondary | ICD-10-CM

## 2015-01-17 DIAGNOSIS — R059 Cough, unspecified: Secondary | ICD-10-CM

## 2015-01-17 DIAGNOSIS — L299 Pruritus, unspecified: Secondary | ICD-10-CM | POA: Diagnosis not present

## 2015-01-17 NOTE — Patient Instructions (Signed)
Imodium as needed  Benefiber 1 heaping tablespoon each morning in 6 oz of water.   Follow up with Dr. Leone PayorGessner or Lawson FiscalLori Hvozdovic in one month.   Your physician has requested that you go to the basement for lab work  And a chest xray before leaving today.
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
## 2015-01-18 ENCOUNTER — Ambulatory Visit (INDEPENDENT_AMBULATORY_CARE_PROVIDER_SITE_OTHER)
Admission: RE | Admit: 2015-01-18 | Discharge: 2015-01-18 | Disposition: A | Discharge status: Home / Self Care | Payer: Medicare Other | Source: Ambulatory Visit | Attending: Physician Assistant | Admitting: Physician Assistant

## 2015-01-18 ENCOUNTER — Other Ambulatory Visit: Payer: Medicare Other

## 2015-01-18 DIAGNOSIS — R05 Cough: Secondary | ICD-10-CM | POA: Diagnosis not present

## 2015-01-18 DIAGNOSIS — R197 Diarrhea, unspecified: Secondary | ICD-10-CM

## 2015-01-18 DIAGNOSIS — R059 Cough, unspecified: Secondary | ICD-10-CM

## 2015-01-18 LAB — CBC WITH DIFFERENTIAL/PLATELET
BASOS PCT: 0.4 % (ref 0.0–3.0)
Basophils Absolute: 0 10*3/uL (ref 0.0–0.1)
EOS ABS: 0.3 10*3/uL (ref 0.0–0.7)
EOS PCT: 3.8 % (ref 0.0–5.0)
HEMATOCRIT: 35.2 % — AB (ref 39.0–52.0)
HEMOGLOBIN: 11.1 g/dL — AB (ref 13.0–17.0)
LYMPHS PCT: 16.6 % (ref 12.0–46.0)
Lymphs Abs: 1.3 10*3/uL (ref 0.7–4.0)
MCHC: 31.6 g/dL (ref 30.0–36.0)
MCV: 75.9 fl — ABNORMAL LOW (ref 78.0–100.0)
Monocytes Absolute: 0.7 10*3/uL (ref 0.1–1.0)
Monocytes Relative: 9 % (ref 3.0–12.0)
Neutro Abs: 5.5 10*3/uL (ref 1.4–7.7)
Neutrophils Relative %: 70.2 % (ref 43.0–77.0)
Platelets: 259 10*3/uL (ref 150.0–400.0)
RBC: 4.64 Mil/uL (ref 4.22–5.81)
RDW: 20.8 % — AB (ref 11.5–15.5)
WBC: 7.9 10*3/uL (ref 4.0–10.5)

## 2015-01-18 LAB — COMPREHENSIVE METABOLIC PANEL
ALBUMIN: 3.6 g/dL (ref 3.5–5.2)
ALK PHOS: 63 U/L (ref 39–117)
ALT: 16 U/L (ref 0–53)
AST: 17 U/L (ref 0–37)
BILIRUBIN TOTAL: 0.5 mg/dL (ref 0.2–1.2)
BUN: 69 mg/dL — ABNORMAL HIGH (ref 6–23)
CALCIUM: 9.2 mg/dL (ref 8.4–10.5)
CHLORIDE: 109 meq/L (ref 96–112)
CO2: 17 mEq/L — ABNORMAL LOW (ref 19–32)
CREATININE: 3 mg/dL — AB (ref 0.40–1.50)
GFR: 21.82 mL/min — ABNORMAL LOW (ref >60.00)
Glucose, Bld: 106 mg/dL — ABNORMAL HIGH (ref 70–99)
Potassium: 4.2 mEq/L (ref 3.5–5.1)
Sodium: 136 mEq/L (ref 135–145)
Total Protein: 7.7 g/dL (ref 6.0–8.3)

## 2015-01-18 LAB — LIPASE: LIPASE: 66 U/L — AB (ref 11.0–59.0)

## 2015-01-19 ENCOUNTER — Other Ambulatory Visit: Payer: Self-pay

## 2015-01-19 DIAGNOSIS — R197 Diarrhea, unspecified: Secondary | ICD-10-CM

## 2015-01-19 LAB — CLOSTRIDIUM DIFFICILE BY PCR: Toxigenic C. Difficile by PCR: NOT DETECTED

## 2015-01-23 ENCOUNTER — Other Ambulatory Visit: Payer: Medicare Other

## 2015-01-23 DIAGNOSIS — R197 Diarrhea, unspecified: Secondary | ICD-10-CM

## 2015-01-24 ENCOUNTER — Emergency Department (HOSPITAL_COMMUNITY): Payer: Medicare Other

## 2015-01-24 ENCOUNTER — Observation Stay (HOSPITAL_COMMUNITY)
Admission: EM | Admit: 2015-01-24 | Discharge: 2015-01-27 | Disposition: A | Discharge status: Home / Self Care | Payer: Medicare Other | Attending: Internal Medicine | Admitting: Internal Medicine

## 2015-01-24 ENCOUNTER — Encounter (HOSPITAL_COMMUNITY): Payer: Self-pay | Admitting: Emergency Medicine

## 2015-01-24 DIAGNOSIS — Z79899 Other long term (current) drug therapy: Secondary | ICD-10-CM | POA: Diagnosis not present

## 2015-01-24 DIAGNOSIS — Z87442 Personal history of urinary calculi: Secondary | ICD-10-CM | POA: Diagnosis not present

## 2015-01-24 DIAGNOSIS — R799 Abnormal finding of blood chemistry, unspecified: Secondary | ICD-10-CM | POA: Diagnosis present

## 2015-01-24 DIAGNOSIS — N400 Enlarged prostate without lower urinary tract symptoms: Secondary | ICD-10-CM | POA: Diagnosis not present

## 2015-01-24 DIAGNOSIS — N179 Acute kidney failure, unspecified: Principal | ICD-10-CM | POA: Diagnosis present

## 2015-01-24 DIAGNOSIS — N321 Vesicointestinal fistula: Secondary | ICD-10-CM | POA: Diagnosis present

## 2015-01-24 DIAGNOSIS — Z872 Personal history of diseases of the skin and subcutaneous tissue: Secondary | ICD-10-CM | POA: Insufficient documentation

## 2015-01-24 DIAGNOSIS — G629 Polyneuropathy, unspecified: Secondary | ICD-10-CM | POA: Diagnosis not present

## 2015-01-24 DIAGNOSIS — H02839 Dermatochalasis of unspecified eye, unspecified eyelid: Secondary | ICD-10-CM | POA: Diagnosis present

## 2015-01-24 DIAGNOSIS — K219 Gastro-esophageal reflux disease without esophagitis: Secondary | ICD-10-CM | POA: Diagnosis present

## 2015-01-24 DIAGNOSIS — Z8739 Personal history of other diseases of the musculoskeletal system and connective tissue: Secondary | ICD-10-CM | POA: Diagnosis not present

## 2015-01-24 DIAGNOSIS — E119 Type 2 diabetes mellitus without complications: Secondary | ICD-10-CM | POA: Diagnosis not present

## 2015-01-24 DIAGNOSIS — D509 Iron deficiency anemia, unspecified: Secondary | ICD-10-CM | POA: Diagnosis not present

## 2015-01-24 DIAGNOSIS — K469 Unspecified abdominal hernia without obstruction or gangrene: Secondary | ICD-10-CM | POA: Diagnosis not present

## 2015-01-24 DIAGNOSIS — N189 Chronic kidney disease, unspecified: Secondary | ICD-10-CM | POA: Diagnosis present

## 2015-01-24 DIAGNOSIS — R310 Gross hematuria: Secondary | ICD-10-CM | POA: Diagnosis present

## 2015-01-24 DIAGNOSIS — Z7952 Long term (current) use of systemic steroids: Secondary | ICD-10-CM | POA: Diagnosis not present

## 2015-01-24 DIAGNOSIS — N3020 Other chronic cystitis without hematuria: Secondary | ICD-10-CM | POA: Diagnosis not present

## 2015-01-24 DIAGNOSIS — E785 Hyperlipidemia, unspecified: Secondary | ICD-10-CM | POA: Insufficient documentation

## 2015-01-24 DIAGNOSIS — I10 Essential (primary) hypertension: Secondary | ICD-10-CM | POA: Insufficient documentation

## 2015-01-24 DIAGNOSIS — E872 Acidosis, unspecified: Secondary | ICD-10-CM | POA: Diagnosis present

## 2015-01-24 LAB — COMPREHENSIVE METABOLIC PANEL
ALT: 17 U/L (ref 17–63)
ANION GAP: 15 (ref 5–15)
AST: 17 U/L (ref 15–41)
Albumin: 3.1 g/dL — ABNORMAL LOW (ref 3.5–5.0)
Alkaline Phosphatase: 71 U/L (ref 38–126)
BUN: 77 mg/dL — AB (ref 6–20)
CHLORIDE: 106 mmol/L (ref 101–111)
CO2: 10 mmol/L — ABNORMAL LOW (ref 22–32)
Calcium: 9.2 mg/dL (ref 8.9–10.3)
Creatinine, Ser: 3.3 mg/dL — ABNORMAL HIGH (ref 0.61–1.24)
GFR calc Af Amer: 20 mL/min — ABNORMAL LOW (ref >60)
GFR, EST NON AFRICAN AMERICAN: 17 mL/min — AB (ref >60)
Glucose, Bld: 177 mg/dL — ABNORMAL HIGH (ref 65–99)
POTASSIUM: 4.2 mmol/L (ref 3.5–5.1)
Sodium: 131 mmol/L — ABNORMAL LOW (ref 135–145)
Total Bilirubin: 0.7 mg/dL (ref 0.3–1.2)
Total Protein: 7.7 g/dL (ref 6.5–8.1)

## 2015-01-24 LAB — CBC
HEMATOCRIT: 35.4 % — AB (ref 39.0–52.0)
HEMOGLOBIN: 11.2 g/dL — AB (ref 13.0–17.0)
MCH: 24.2 pg — ABNORMAL LOW (ref 26.0–34.0)
MCHC: 31.6 g/dL (ref 30.0–36.0)
MCV: 76.6 fL — AB (ref 78.0–100.0)
Platelets: 251 10*3/uL (ref 150–400)
RBC: 4.62 MIL/uL (ref 4.22–5.81)
RDW: 18.6 % — AB (ref 11.5–15.5)
WBC: 9.8 10*3/uL (ref 4.0–10.5)

## 2015-01-24 LAB — URINE MICROSCOPIC-ADD ON

## 2015-01-24 LAB — URINALYSIS, ROUTINE W REFLEX MICROSCOPIC
Bilirubin Urine: NEGATIVE
Glucose, UA: NEGATIVE mg/dL
Ketones, ur: NEGATIVE mg/dL
NITRITE: NEGATIVE
PH: 6 (ref 5.0–8.0)
Protein, ur: 100 mg/dL — AB
SPECIFIC GRAVITY, URINE: 1.014 (ref 1.005–1.030)
Urobilinogen, UA: 0.2 mg/dL (ref 0.0–1.0)

## 2015-01-24 LAB — PROTIME-INR
INR: 1.12 (ref 0.00–1.49)
Prothrombin Time: 14.6 seconds (ref 11.6–15.2)

## 2015-01-24 LAB — LIPASE, BLOOD: LIPASE: 48 U/L (ref 11–51)

## 2015-01-24 MED ORDER — BENZONATATE 100 MG PO CAPS
200.0000 mg | ORAL_CAPSULE | Freq: Two times a day (BID) | ORAL | Status: DC | PRN
  Administered 2015-01-24: 200 mg via ORAL
  Filled 2015-01-24: qty 2

## 2015-01-24 MED ORDER — ONDANSETRON HCL 4 MG PO TABS: 4.0000 mg | ORAL_TABLET | Freq: Four times a day (QID) | ORAL | Status: DC | PRN

## 2015-01-24 MED ORDER — DM-GUAIFENESIN ER 30-600 MG PO TB12: 1.0000 | ORAL_TABLET | Freq: Two times a day (BID) | ORAL | Status: DC | PRN

## 2015-01-24 MED ORDER — SODIUM CHLORIDE 0.9 % IV SOLN
INTRAVENOUS | Status: DC
  Administered 2015-01-24 – 2015-01-27 (×4): via INTRAVENOUS

## 2015-01-24 MED ORDER — TRIAMCINOLONE ACETONIDE 0.1 % EX CREA
1.0000 "application " | TOPICAL_CREAM | Freq: Every day | CUTANEOUS | Status: DC | PRN
  Filled 2015-01-24: qty 15

## 2015-01-24 MED ORDER — CIPROFLOXACIN HCL 500 MG PO TABS
500.0000 mg | ORAL_TABLET | Freq: Once | ORAL | Status: AC
  Administered 2015-01-24: 500 mg via ORAL
  Filled 2015-01-24: qty 1

## 2015-01-24 MED ORDER — INFLUENZA VAC SPLIT QUAD 0.5 ML IM SUSY
0.5000 mL | PREFILLED_SYRINGE | INTRAMUSCULAR | Status: AC
  Administered 2015-01-25: 0.5 mL via INTRAMUSCULAR
  Filled 2015-01-24: qty 0.5

## 2015-01-24 MED ORDER — HYDROXYZINE HCL 10 MG PO TABS: 10.0000 mg | ORAL_TABLET | Freq: Every day | ORAL | Status: DC | PRN

## 2015-01-24 MED ORDER — SODIUM CHLORIDE 0.9 % IV BOLUS (SEPSIS)
1000.0000 mL | Freq: Once | INTRAVENOUS | Status: AC
  Administered 2015-01-24: 1000 mL via INTRAVENOUS

## 2015-01-24 MED ORDER — PREDNISONE 5 MG PO TABS: 5.0000 mg | ORAL_TABLET | Freq: Every day | ORAL | Status: DC

## 2015-01-24 MED ORDER — LORATADINE 10 MG PO TABS
10.0000 mg | ORAL_TABLET | Freq: Every day | ORAL | Status: DC
  Administered 2015-01-25 – 2015-01-26 (×2): 10 mg via ORAL
  Filled 2015-01-24 (×2): qty 1

## 2015-01-24 MED ORDER — ACETAMINOPHEN 500 MG PO TABS
500.0000 mg | ORAL_TABLET | Freq: Four times a day (QID) | ORAL | Status: DC | PRN
  Administered 2015-01-24 – 2015-01-27 (×5): 500 mg via ORAL
  Filled 2015-01-24 (×5): qty 1

## 2015-01-24 MED ORDER — ONDANSETRON HCL 4 MG/2ML IJ SOLN: 4.0000 mg | Freq: Four times a day (QID) | INTRAMUSCULAR | Status: DC | PRN

## 2015-01-24 NOTE — ED Provider Notes (Signed)
CSN: [id number]     Arrival date & time 01/24/15  1334 History   First MD Initiated Contact with Patient 01/24/15 1604     Chief Complaint  Patient presents with  . Abnormal Lab  . Cough     (Consider location/radiation/quality/duration/timing/severity/associated sxs/prior Treatment) HPI   Patient is a 74 year old male referred today from his outpatient provider. 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 Since then patient had hematuria. He's been followed by urology who did not see any cancer. Patient recently referred to a specialist in at [facility].   Patient has had a couple days of increasing blood in urine. He's had to urinate more frequently than usual. No fever. Patient does feel more worn down than usual.  Of note patient had hospitalization in May 1 similar thing. He had increasing creatinine due to obstructive hemorrhagic cystitis. Had to have 3 way foley, hydration for increase in Cr.   Past Medical History  Diagnosis Date  . GERD (gastroesophageal reflux disease)   . HTN (hypertension)   . Nephrolithiasis   . Chronic cystitis   . Headache   . Anemia   . Right bundle branch block (RBBB)   . Benign essential tremor   . Type 2 diabetes mellitus (HCC)   . Hyperlipidemia   . Diverticulosis of colon   . History of diverticulitis of colon     adx 07-06-2009 w/ abscess  . Gross hematuria   . CVF (colovesical fistula)     repair 03-23-2014  . History of gout   . Lower urinary tract symptoms (LUTS)   . Dysuria   . Itching     back, arms,  hand,  feet--  chronic itching since surgery in Dec 2015  . At risk for sleep apnea     STOP-BANG= 5       SENT TO PCP 07-20-2014  . BPH (benign prostatic hyperplasia)   . Blight of right eye     Traumatic burn to eye  . Peripheral neuropathy (HCC)   . Erectile dysfunction   . IDA (iron deficiency anemia)    Past Surgical History  Procedure Laterality Date  .  Rotator cuff repair Left 2005  . Arm surgery Left 1988    torn ligament  . Colonoscopy N/A 03/22/2014    Procedure: COLONOSCOPY;  Surgeon: Louis Meckel, MD;  Location: [facility];  Service: Endoscopy;  Laterality: N/A;  . Appendectomy N/A 03/23/2014    Procedure: APPENDECTOMY;  Surgeon: Karie Soda, MD;  Location: [facility];  Service: General;  Laterality: N/A;  . Proctoscopy N/A 03/23/2014    Procedure: RIGID PROCTOSCOPY;  Surgeon: Karie Soda, MD;  Location: [facility];  Service: General;  Laterality: N/A;  . Laparoscopic partial colectomy N/A 03/23/2014    Procedure: LAPAROSCOPIC LEFT  COLECTOMY, SPLENIC FLEXURE MOBILIZATION,  APPENDECTOMY, REPAIR BLADDER, COLOVESICAL FISTULA;  Surgeon: Karie Soda, MD;  Location: [facility];  Service: General;  Laterality: N/A;  . Cystoscopy with stent placement Bilateral 03/23/2014    Procedure: CYSTOSCOPY WITH BILATERAL URETERAL CATHETER  PLACEMENT;  Surgeon: Valetta Fuller, MD;  Location: [facility];  Service: Urology;  Laterality: Bilateral;  . 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 Eye surgery Right 1963    eyelid  . Inguinal hernia repair Right 1950  . Cystoscopy with biopsy N/A 07/24/2014    Procedure: CYSTOSCOPY WITH BIOPSY;  Surgeon: Barron Alvine, MD;  Location: [facility];  Service: Urology;  Laterality: N/A;  . Cystoscopy w/ retrogrades Bilateral 07/24/2014    Procedure: CYSTOSCOPY WITH RETROGRADE PYELOGRAM;  Surgeon: Barron Alvine, MD;  Location: [facility];  Service: Urology;  Laterality: Bilateral;  . Cystogram N/A 07/24/2014    Procedure: CYSTOGRAM;  Surgeon: Barron Alvine, MD;  Location: [facility];  Service: Urology;  Laterality: N/A;   Family History  Problem Relation Age of Onset  . Heart failure Father   . Emphysema Mother   . Colon cancer Neg Hx    Social History  Substance Use Topics  . Smoking status: Never Smoker   . Smokeless tobacco: Never Used  . Alcohol Use: 0.0 oz/week    0  Standard drinks or equivalent per week     Comment: 1 time a week    Review of Systems  Constitutional: Negative for fever, chills, activity change, appetite change and fatigue.  HENT: Negative for drooling and hearing loss.   Eyes: Negative for discharge and redness.  Respiratory: Positive for cough. Negative for shortness of breath.   Cardiovascular: Negative for chest pain.  Gastrointestinal: Negative for abdominal pain.  Genitourinary: Positive for dysuria, frequency, hematuria and difficulty urinating. Negative for urgency.  Musculoskeletal: Negative for arthralgias.  Allergic/Immunologic: Negative for immunocompromised state.  Neurological: Negative for seizures.  Psychiatric/Behavioral: Negative for behavioral problems.  All other systems reviewed and are negative.     Allergies  Other; Primidone; Statins; and Oxycodone  Home Medications   Prior to Admission medications   Medication Sig Start Date End Date Taking? Authorizing Provider  acetaminophen (TYLENOL) 500 MG tablet Take 500 mg by mouth every 6 (six) hours as needed for moderate pain or headache.   Yes Historical Provider, MD  cetirizine (ZYRTEC) 10 MG tablet Take 10 mg by mouth daily.   Yes Historical Provider, MD  hydrOXYzine (ATARAX/VISTARIL) 10 MG tablet Take 10 mg by mouth daily as needed for itching. Take 1 to 3 tablets at bedtime as needed   Yes Historical Provider, MD  Polyvinyl Alcohol-Povidone (REFRESH OP) Place 1 drop into both eyes daily as needed (dry eyes).   Yes Historical Provider, MD  predniSONE (DELTASONE) 5 MG tablet Take 5 mg by mouth daily. 09/27/14  Yes Historical Provider, MD  triamcinolone cream (KENALOG) 0.1 % Apply 1 application topically daily as needed. For rash   Yes Historical Provider, MD  propranolol ER (INDERAL LA) 60 MG 24 hr capsule Take 1 capsule (60 mg total) by mouth daily. Patient not taking: Reported on 01/24/2015 12/25/14   Lurena Joiner S Tat, DO   BP 147/65 mmHg  Pulse 71   Temp(Src) 97.3 F (36.3 C) (Oral)  Resp 18  Ht  (1.778 m)  Wt 211 lb (95.709 kg)  BMI 30.28 kg/m2  SpO2 97% Physical Exam  Constitutional: He is oriented to person, place, and time. He appears well-nourished.  HENT:  Head: Normocephalic.  Mouth/Throat: Oropharynx is clear and moist.  Eyes: Conjunctivae are normal.  Neck: No tracheal deviation present.  Cardiovascular: Normal rate.   Pulmonary/Chest: Effort normal. No stridor. No respiratory distress. He has no wheezes. He has no rales.  Abdominal: Soft. There is no tenderness. There is no guarding.  Multiple hernias  Musculoskeletal: Normal range of motion. He exhibits no edema.  Neurological: He is oriented to person, place, and time. No cranial nerve deficit.  Skin: Skin is warm and dry. No rash noted. He is not diaphoretic.  Psychiatric: He has a normal mood and affect.  Nursing note and vitals reviewed.   ED Course  Procedures (including critical care time) Labs Review Labs Reviewed  COMPREHENSIVE METABOLIC PANEL - Abnormal; Notable for the following:    Sodium 131 (*)    CO2 10 (*)    Glucose, Bld 177 (*)    BUN 77 (*)    Creatinine, Ser 3.30 (*)    Albumin 3.1 (*)    GFR calc non Af Amer 17 (*)    GFR calc Af Amer 20 (*)    All other components within normal limits  CBC - Abnormal; Notable for the following:    Hemoglobin 11.2 (*)    HCT 35.4 (*)    MCV 76.6 (*)    MCH 24.2 (*)    RDW 18.6 (*)    All other components within normal limits  URINALYSIS, ROUTINE W REFLEX MICROSCOPIC (NOT AT [facility]) - Abnormal; Notable for the following:    Color, Urine RED (*)    APPearance CLOUDY (*)    Hgb urine dipstick LARGE (*)    Protein, ur 100 (*)    Leukocytes, UA LARGE (*)    All other components within normal limits  URINE MICROSCOPIC-ADD ON - Abnormal; Notable for the following:    Squamous Epithelial / LPF FEW (*)    Bacteria, UA FEW (*)    All other components within normal limits  URINE CULTURE  LIPASE,  BLOOD    Imaging Review Dg Chest 2 View  01/24/2015  CLINICAL DATA:  Acute cough and congestion for 1 week. EXAM: CHEST  2 VIEW COMPARISON:  01/18/2015 FINDINGS: Normal heart size and vascularity. Lungs remain clear. No focal pneumonia, collapse or consolidation. Negative for edema, effusion or pneumothorax. Trachea midline. Degenerative changes of the spine. No significant interval change. IMPRESSION: Stable chest exam without acute process. Electronically Signed   By: Judie PetitM.  Shick M.D.   On: 01/24/2015 14:20   I have personally reviewed and evaluated these images and lab results as part of my medical decision-making.   EKG Interpretation None      MDM   Final diagnoses:  None   patient is a very friendly 74 year old male presenting today with elevated creatinine from his primary care physician. Patient's had difficulty with hematuria since his surgery 1 year ago. Today patient's creatinine is elevated to 3.3 from his baseline of 1.75. Patient sent here for hydration. We will place a three-way Foley and irrigate. Patient has positive leukocytes with hematuria therefor concern for infection as well as we will treat with Cipro. We will give IV hydration to help with his acute kidney injury.   Patient had a Cough for the last 2 weeks since a recent trip. No hypoxia, no tachypnea, no leg swelling. Chest x-ray negative. Do not suspect other etiology such as PE at this time given normal vital signs.    4:36 PM Urology agrees with plan for 3 way, hydration.  They will see in house    [place] Firkus Randall AnLyn Brennin Durfee, MD 01/24/15 1640

## 2015-01-24 NOTE — ED Notes (Signed)
Pt sent here by PCP for eval of elevated kidney levels and cough with generalized weakness; pt c/o dark urine

## 2015-01-24 NOTE — Progress Notes (Signed)
Admission note:  Arrival Method: ED stretcher  Mental Orientation: alert & oriented x 4  Telemetry: no ordered  Assessment: in progress Skin: intact IV: left arm with normal saline at 75mL per hour Pain: pt denies  Tubes: three way foley with continuous bladder irrigation  Safety Measures: Patient Handbook has been given, and discussed the Fall Prevention worksheet. Left at bedside  Admission: Completed and admission orders have been written  6E Orientation: Patient has been oriented to the unit, staff and to the room.  Family: At the bedside-wife    [facility] Center[facility]ie[facility] Maie Kesinger BSN, RN [facility] Phone [phone]

## 2015-01-24 NOTE — ED Notes (Signed)
Pt states that last year he had a procedure involving his bladder and intestines and his urine remained red until he came to the hospital and received IV fluids. States it was clear after that and over the past week his urine has turned red again. Denies pain with urination.

## 2015-01-24 NOTE — H&P (Signed)
[facility] History and Physical  Scott CreteGeorge H Mouser UJW:[id number]:[id number] DOB: 1941-02-28 DOA: 01/24/2015  Referring physician: Emergency Department PCP: Ginette OttoSTONEKING,HAL THOMAS, MD   CHIEF COMPLAINT: blood in urine   HPI: Scott Nixon is a 74 y.o. male sent by PCP to ED for evaluation of hematuria. In late October 2015 patient underwent CT scan for evaluation of abdominal pain. CT revealed sigmoid diverticulitis with probable colovesical fistula. Subsequent to that, a cystoscopy was done and also concerning for a colovesical fistula. A colonoscopy was arranged to be done around the same time as surgery to avoid two bowel preps. The colonoscopy 03/23/14 revealed severe left-sided diverticulosis and a submucosal lesion in cecum near the appendix. Patient taken to OR the following day where he underwent a laparoscopic left colectomy / appendectomy and repair of colovesical fistula.   Since surgery patient has had frequent urination (multiple times a day). He has also had intermittent hematuria and has chronic cystitis. Urology has been trying to figure out why the ongoing bleeding and voiding problems. No recurrent fistula on CTscan in June. Patient states a barium enema around that time was also negative for recurrent fistula. Bleeding cleared up in May following bladder irrigations. A week ago bleeding started back. No dysuria. Creatinine slowly rising over the last few months . Urology referring [facility]. Since surgery patient has lost 50 pounds, eight of those in the last several days. He developed a dry cough a week ago and poor appetite since then.    ED COURSE:   Vitals: Afebrile.  Normotensive. Received a dose of Cipro. ED consulted Urology.   Labs:   WBC  9.8 , hemoglobin 11.2, BUN 77, creatinine 3.3,  bicarbonate 10 Urinalysis:  Cloudy, few bacteria, large leukocytes, negative nitrites, pH 6, RBCs too numerous to count, WBCs too numerous to count, only a few  bacteria.       CXR:   No acute processes   Medications  ciprofloxacin (CIPRO) tablet 500 mg (500 mg Oral Given 01/24/15 1656)  sodium chloride 0.9 % bolus 1,000 mL (1,000 mLs Intravenous New Bag/Given 01/24/15 1654)    REVIEW OF SYSTEMS:  Review of Systems  Constitutional: Positive for weight loss.  Eyes: Negative.   Respiratory: Positive for cough. Negative for hemoptysis, sputum production, shortness of breath and wheezing.   Cardiovascular: Negative.   Gastrointestinal: Negative.   Genitourinary: Positive for frequency and hematuria. Negative for dysuria, urgency and flank pain.  Musculoskeletal: Negative.   Skin: Positive for rash.  Neurological: Negative.   Endo/Heme/Allergies: Negative.   Psychiatric/Behavioral: Negative.     Past Medical History  Diagnosis Date  . GERD (gastroesophageal reflux disease)   . HTN (hypertension)   . Nephrolithiasis   . Chronic cystitis   . Anemia   . Right bundle branch block (RBBB)   . Benign essential tremor   . Type 2 diabetes mellitus (HCC)   . Hyperlipidemia   . Diverticulosis of colon   . History of diverticulitis of colon     adx 07-06-2009 w/ abscess  . Gross hematuria   . CVF (colovesical fistula)     repair 03-23-2014  . History of gout   . Lower urinary tract symptoms (LUTS)   . Itching     back, arms,  hand,  feet--  chronic itching since surgery in Dec 2015  . At risk for sleep apnea     STOP-BANG= 5       SENT TO PCP 07-20-2014  .  BPH (benign prostatic hyperplasia)   . Blight of right eye     Traumatic burn to eye  . Peripheral neuropathy (HCC)   . Erectile dysfunction   . IDA (iron deficiency anemia)    Past Surgical History  Procedure Laterality Date  . Rotator cuff repair Left 2005  . Arm surgery Left 1988    torn ligament  . Colonoscopy N/A 03/22/2014    Procedure: COLONOSCOPY;  Surgeon: Louis Meckel, MD;  [facility]: [facility];  Service: Endoscopy;  Laterality: N/A;  . Appendectomy N/A  03/23/2014    Procedure: APPENDECTOMY;  Surgeon: Karie Soda, MD;  Location: [facility];  Service: General;  Laterality: N/A;  . Proctoscopy N/A 03/23/2014    Procedure: RIGID PROCTOSCOPY;  Surgeon: Karie Soda, MD;  Location: [facility];  Service: General;  Laterality: N/A;  . Laparoscopic partial colectomy N/A 03/23/2014    Procedure: LAPAROSCOPIC LEFT  COLECTOMY, SPLENIC FLEXURE MOBILIZATION,  APPENDECTOMY, REPAIR BLADDER, COLOVESICAL FISTULA;  Surgeon: Karie Soda, MD;  Location: [facility];  Service: General;  Laterality: N/A;  . Cystoscopy with stent placement Bilateral 03/23/2014    Procedure: CYSTOSCOPY WITH BILATERAL URETERAL CATHETER  PLACEMENT;  Surgeon: Valetta Fuller, MD;  Location: [facility];  Service: Urology;  Laterality: Bilateral;  . 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 Eye surgery Right 1963    eyelid  . Inguinal hernia repair Right 1950  . Cystoscopy with biopsy N/A 07/24/2014    Procedure: CYSTOSCOPY WITH BIOPSY;  Surgeon: Barron Alvine, MD;  Location: [facility];  Service: Urology;  Laterality: N/A;  . Cystoscopy w/ retrogrades Bilateral 07/24/2014    Procedure: CYSTOSCOPY WITH RETROGRADE PYELOGRAM;  Surgeon: Barron Alvine, MD;  Location: [facility];  Service: Urology;  Laterality: Bilateral;  . Cystogram N/A 07/24/2014    Procedure: CYSTOGRAM;  Surgeon: Barron Alvine, MD;  Location: [facility];  Service: Urology;  Laterality: N/A;    SOCIAL HISTORY:  reports that he has never smoked. He has never used smokeless tobacco. He reports that he drinks alcohol. He reports that he does not use illicit drugs. Lives:  at home With:    Significant other Assistive devices:   None needed for ambulation.   Allergies  Allergen Reactions  . Other     Unknown medication that caused severe itching and scaling of hands and feet --  During admission Dec 2015  . Primidone Other (See Comments)    Lethargy  . Statins   . Oxycodone Rash    Rash and  itching     Family History  Problem Relation Age of Onset  . Heart failure Father   . Emphysema Mother   . Colon cancer Neg Hx      Prior to Admission medications   Medication Sig Start Date End Date Taking? Authorizing Provider  acetaminophen (TYLENOL) 500 MG tablet Take 500 mg by mouth every 6 (six) hours as needed for moderate pain or headache.   Yes Historical Provider, MD  cetirizine (ZYRTEC) 10 MG tablet Take 10 mg by mouth daily.   Yes Historical Provider, MD  hydrOXYzine (ATARAX/VISTARIL) 10 MG tablet Take 10 mg by mouth daily as needed for itching. Take 1 to 3 tablets at bedtime as needed   Yes Historical Provider, MD  Polyvinyl Alcohol-Povidone (REFRESH OP) Place 1 drop into both eyes daily as needed (dry eyes).   Yes Historical Provider, MD  predniSONE (DELTASONE) 5 MG tablet Take 5 mg by mouth daily.  09/27/14  Yes Historical Provider, MD  triamcinolone cream (KENALOG) 0.1 % Apply 1 application topically daily as needed. For rash   Yes Historical Provider, MD  propranolol ER (INDERAL LA) 60 MG 24 hr capsule Take 1 capsule (60 mg total) by mouth daily. Patient not taking: Reported on 01/24/2015 12/25/14   Octaviano Batty Tat, DO   PHYSICAL EXAM: Filed Vitals:   01/24/15 1338  BP: 147/65  Pulse: 71  Temp: 97.3 F (36.3 C)  TempSrc: Oral  Resp: 18  Height: 5\' 10"  (1.778 m)  Weight: 95.709 kg (211 lb)  SpO2: 97%    Wt Readings from Last 3 Encounters:  01/24/15 95.709 kg (211 lb)  01/17/15 99.428 kg (219 lb 3.2 oz)  12/25/14 100.245 kg (221 lb)    General:  Pleasant white male. Appears calm and comfortable Eyes: PER, normal lids, irises & conjunctiva ENT: grossly normal hearing, lips & tongue Neck: no LAD, no masses Cardiovascular: RRR, no LE edema.  Respiratory: Respirations even and unlabored. Normal respiratory effort. Lungs CTA bilaterally, no wheezes / rales .   Abdomen: soft, non-distended, non-tender, active bowel sounds. No obvious masses.  Skin: small rash left  upper should blade.  Musculoskeletal: grossly normal tone BUE/BLE Psychiatric: grossly normal mood and affect, speech fluent and appropriate Neurologic: grossly non-focal.         LABS ON ADMISSION:    Basic Metabolic Panel:  Recent Labs Lab 01/24/15 1349  NA 131*  K 4.2  CL 106  CO2 10*  GLUCOSE 177*  BUN 77*  CREATININE 3.30*  CALCIUM 9.2   Liver Function Tests:  Recent Labs Lab 01/24/15 1349  AST 17  ALT 17  ALKPHOS 71  BILITOT 0.7  PROT 7.7  ALBUMIN 3.1*    Recent Labs Lab 01/24/15 1349  LIPASE 48    CBC:  Recent Labs Lab 01/24/15 1349  WBC 9.8  HGB 11.2*  HCT 35.4*  MCV 76.6*  PLT 251   Radiological Exams on Admission: Dg Chest 2 View  01/24/2015  CLINICAL DATA:  Acute cough and congestion for 1 week. EXAM: CHEST  2 VIEW COMPARISON:  01/18/2015 FINDINGS: Normal heart size and vascularity. Lungs remain clear. No focal pneumonia, collapse or consolidation. Negative for edema, effusion or pneumothorax. Trachea midline. Degenerative changes of the spine. No significant interval change. IMPRESSION: Stable chest exam without acute process. Electronically Signed   By: Judie Petit.  Shick M.D.   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 Creatinine rising over last several months. -admit for observation.  -Urology is planning to see.  -IVF -strict I&0 -recheck am BMET   Gross hematuria / pyuria. He has frequent urination / known chronic cystitis.  Urology following closely on outpatient basis and referring him to a specialist at [facility] soon. Today's urine + for large  blood and large WBCs. Got a dose of Cipro in ED. Urology to see inpatient but ED has spoken to them over phone and are placing foley and starting bladder irrigation per Urology's recommendation.  -check coags -await Urology evaluation  Microcytic anemia,  likely secondary to chronic hematuria. Stable.  Bowel changes, recent diarrhea. C-diff negative at GI office several days ago.Was takijng Flaxseed for constipation. Stools now solidifying off flaxseed.   CONSULTANTS:   Urology  Code Status: DNR DVT Prophylaxis: SCDs Family Communication: Patient alert, oriented and understands plan of care.  Disposition Plan: Discharge home in 24-48 hours Time spent: 60 minutes  Willette Cluster  NP [facility] Pager [phone]

## 2015-01-25 DIAGNOSIS — N179 Acute kidney failure, unspecified: Secondary | ICD-10-CM

## 2015-01-25 DIAGNOSIS — N3020 Other chronic cystitis without hematuria: Secondary | ICD-10-CM

## 2015-01-25 DIAGNOSIS — E119 Type 2 diabetes mellitus without complications: Secondary | ICD-10-CM

## 2015-01-25 DIAGNOSIS — N321 Vesicointestinal fistula: Secondary | ICD-10-CM

## 2015-01-25 DIAGNOSIS — N189 Chronic kidney disease, unspecified: Secondary | ICD-10-CM

## 2015-01-25 DIAGNOSIS — K219 Gastro-esophageal reflux disease without esophagitis: Secondary | ICD-10-CM | POA: Diagnosis not present

## 2015-01-25 DIAGNOSIS — D509 Iron deficiency anemia, unspecified: Secondary | ICD-10-CM

## 2015-01-25 DIAGNOSIS — E872 Acidosis: Secondary | ICD-10-CM

## 2015-01-25 DIAGNOSIS — R310 Gross hematuria: Secondary | ICD-10-CM

## 2015-01-25 LAB — BASIC METABOLIC PANEL
ANION GAP: 9 (ref 5–15)
BUN: 65 mg/dL — AB (ref 6–20)
CALCIUM: 8.4 mg/dL — AB (ref 8.9–10.3)
CO2: 14 mmol/L — AB (ref 22–32)
Chloride: 107 mmol/L (ref 101–111)
Creatinine, Ser: 2.51 mg/dL — ABNORMAL HIGH (ref 0.61–1.24)
GFR calc Af Amer: 27 mL/min — ABNORMAL LOW (ref >60)
GFR, EST NON AFRICAN AMERICAN: 24 mL/min — AB (ref >60)
GLUCOSE: 96 mg/dL (ref 65–99)
Potassium: 3.6 mmol/L (ref 3.5–5.1)
Sodium: 130 mmol/L — ABNORMAL LOW (ref 135–145)

## 2015-01-25 LAB — CBC
HEMATOCRIT: 31.6 % — AB (ref 39.0–52.0)
Hemoglobin: 10 g/dL — ABNORMAL LOW (ref 13.0–17.0)
MCH: 24 pg — ABNORMAL LOW (ref 26.0–34.0)
MCHC: 31.6 g/dL (ref 30.0–36.0)
MCV: 76 fL — AB (ref 78.0–100.0)
Platelets: 233 10*3/uL (ref 150–400)
RBC: 4.16 MIL/uL — ABNORMAL LOW (ref 4.22–5.81)
RDW: 18.2 % — AB (ref 11.5–15.5)
WBC: 8.1 10*3/uL (ref 4.0–10.5)

## 2015-01-25 LAB — GLUCOSE, CAPILLARY: Glucose-Capillary: 143 mg/dL — ABNORMAL HIGH (ref 65–99)

## 2015-01-25 MED ORDER — PREDNISONE 20 MG PO TABS
40.0000 mg | ORAL_TABLET | Freq: Every day | ORAL | Status: DC
  Administered 2015-01-25 – 2015-01-27 (×3): 40 mg via ORAL
  Filled 2015-01-25 (×3): qty 2

## 2015-01-25 MED ORDER — PANTOPRAZOLE SODIUM 40 MG PO TBEC
40.0000 mg | DELAYED_RELEASE_TABLET | Freq: Every day | ORAL | Status: DC
  Administered 2015-01-25 – 2015-01-27 (×3): 40 mg via ORAL
  Filled 2015-01-25 (×3): qty 1

## 2015-01-25 MED ORDER — BENZONATATE 100 MG PO CAPS
100.0000 mg | ORAL_CAPSULE | Freq: Three times a day (TID) | ORAL | Status: DC
  Administered 2015-01-25 – 2015-01-26 (×6): 100 mg via ORAL
  Filled 2015-01-25 (×6): qty 1

## 2015-01-25 MED ORDER — DEXTROSE 5 % IV SOLN
500.0000 mg | INTRAVENOUS | Status: DC
  Administered 2015-01-25 – 2015-01-26 (×2): 500 mg via INTRAVENOUS
  Filled 2015-01-25 (×2): qty 500

## 2015-01-25 MED ORDER — GUAIFENESIN ER 600 MG PO TB12
600.0000 mg | ORAL_TABLET | Freq: Two times a day (BID) | ORAL | Status: DC
  Administered 2015-01-25 – 2015-01-26 (×4): 600 mg via ORAL
  Filled 2015-01-25 (×4): qty 1

## 2015-01-25 MED ORDER — DEXTROSE 5 % IV SOLN
1.0000 g | INTRAVENOUS | Status: DC
  Administered 2015-01-25 – 2015-01-27 (×3): 1 g via INTRAVENOUS
  Filled 2015-01-25 (×3): qty 10

## 2015-01-25 MED ORDER — IPRATROPIUM-ALBUTEROL 0.5-2.5 (3) MG/3ML IN SOLN
3.0000 mL | Freq: Four times a day (QID) | RESPIRATORY_TRACT | Status: DC
  Administered 2015-01-25 – 2015-01-26 (×7): 3 mL via RESPIRATORY_TRACT
  Filled 2015-01-25 (×6): qty 3

## 2015-01-25 NOTE — Consult Note (Signed)
This information placed in the hospital chart is from the patient's last office visit with Dr. Isabel Caprice on 12/29/14.  Reason For Visit  Scott Nixon returns for a followup visit. Unfortunately, things are really no better. The last time Scott Nixon was here we did find low colony counts of yeast. Prior to that his last 4 or 5 urine cultures were completely negative despite ongoing severe pyuria. I did treat Scott Nixon with the Diflucan and also added Myrbetriq and neither one of those maneuvers really made much difference for Scott Nixon.   Scott Nixon is now about 10 months status post treatment for a colovesical fistula. Again, our only involvement in the surgery was placement of some preoperative stents. Status post that surgery Scott Nixon did have a complication of a scrotal hematoma. Scott Nixon also has some incisional hernias that need to be addressed by general surgery. His biggest problem has been evidence of severe ongoing bladder inflammation with severe frequency, urgency, and nocturia. We went in endoscopically in April 2016 and Scott Nixon had very nonspecific chronic inflammatory changes in the bladder. Scott Nixon appeared to have a substantially reduced capacity of less than 250 mL. The appearance was an end-stage bladder like we might see with interstitial cystitis. Scott Nixon has had several biopsies and cytologies. On biopsy Scott Nixon had chronic inflammation with some granulation tissue. Scott Nixon has not had any evidence of malignancy. Scott Nixon has had a multitude of CT scans. Scott Nixon does have known bilateral cortical thinning of both kidneys without evidence of obvious obstruction. Scott Nixon has at least stage 3 chronic renal disease with a creatinine in the 1.8 to 2.2 range. 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 I do not have a good explanation for why his bladder shows such ongoing severe inflammation other than potentially idiopathic interstitial cystitis. I did have Scott Nixon on some prednisone for a while which helped a little bit but nothing has really resolved this  problem. It is unclear to me whether Scott Nixon is having some type of inflammatory reaction to medication but I cannot put a finger on the exact diagnosis for Scott Nixon.      History of Present Illness June 2016  GU HX: His situation has been extremely complicated, and there have not been definitive explanations. Last previous notes will outline much of his history. 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 Since that time, Scott Nixon has continued to have a markedly abnormal urine, but almost routinely urine culture continues to be negative. Scott Nixon has been treated with empiric antibiotics. There has been no evidence of recurrent/persistent fistula, especially given the fact that his urine cultures have been negative, and Scott Nixon has had no further episodes of pneumaturia. Scott Nixon has, however, had diffuse bladder inflammation that has been somewhat idiopathic. Biopsies and cytology were negative. Scott Nixon has been noted to have a reduced bladder capacity and in summary has had a diffuse inflammatory picture of his bladder without a definite explanation. Scott Nixon did respond to a Medrol Dosepak and has been on low-dose prednisone. Scott Nixon has intermittently still had hematuria with the last episode occurring about 8 days ago. Scott Nixon definitely feels better and feels like Scott Nixon is making some slow but steady progress. Scott Nixon does, however, continue to have frequency of urination about every hour. His urinalysis today continues to show ongoing microhematuria and some pyuria. CT scan did not reveal any obvious postoperative complications and no evidence of recurrent or persistent fistula. His hemoglobin done recently was 9.8. Apparently, Dr Pete Glatter told Scott Nixon recently  that his renal function was better.  Scott Nixon continues to have evidence of ongoing bladder inflammation but has continued time after time to have a negative culture. Scott Nixon has even been treated empirically with multiple different courses of antibiotics. I definitely do not  want to put Scott Nixon on any antibiotics, unless we can demonstrate definite bacteria on culture. We will reculture his urine today. Fortunately, from a clinical perspective, Scott Nixon is getting better. We will keep Scott Nixon on prednisone for now since it does seem to have helped, but obviously, we will be more concerned about that as a long-term treatment. I have no real issues with going another 2-3 months. I certainly welcome Scott Nixon seeking a second opinion over his constellation of findings, since we have not been able to pin down a definitive etiology for Scott Nixon. That ultimately is his decision. Scott Nixon is having some right lower quadrant abdominal pain with some tenderness. I do not appreciate an obvious hernia, but Scott Nixon does have a tender area in the right lower quadrant. Scott Nixon may readdress that with Dr Michaell CowingGross. I want to see Scott Nixon in 2-3 months with a repeat hemoglobin. We will need to hear from Scott Nixon sooner if Scott Nixon is doing worse rather than better. We will notify Scott Nixon with culture results. The scrotal hematoma is slowly improving.    August 2016: Scott Nixon returns today one month prior to his next visit with Dr Isabel CapriceGrapey: Scott Nixon continues to endorse bothersome frequency, urgency and sometimes leaking if Scott Nixon can not make it to the bathroom in time. Scott Nixon states having tried a sample from Dr Isabel CapriceGrapey before for this but states accidently losing the sample. Scott Nixon can not remember the name.     Today Scott Nixon denies dysuria, recurrent gross hematuria, or flank/back pain. Scott Nixon denies fever or other s/s infection. Scott Nixon is scheduled to have a right hernia repair with Dr Michaell CowingGross in November.   Discussion/Summary  Scott Nixon has had ongoing evidence of severe bladder inflammation. Clearly, objectively, Scott Nixon has had evidence that something significant continues to inflame his bladder with severe pyuria. His last 4 to 5 urine cultures have been negative with the exception of a recent culture that showed a low level of yeast. Today his urine microscopically shows no bacteria or yeast  but is loaded with white blood cells once again. Scott Nixon has also had intermittent hematuria. There has been no definitive evidence of malignancy based on several CT scans, several cystoscopies, biopsies, and urine cytologies. This appears to be an idiopathic inflammatory process but unfortunately Scott Nixon has a marked reduced bladder capacity and evidence of severe inflammation. It is unclear whether this is going to resolve spontaneously. I have never been able to put an exact etiology on this. I have offered Scott Nixon a second opinion on several occasions and they are interested in pursuing that. I offered Scott Nixon the opportunity to be seen by somebody else here at our office or at one of the academic centers. They are [facility]. I suggested that they see my old partner, Dr Darvin Neighbourson Davis, [facility] and it would probably be more convenient for them. Scott Nixon has tremendous experience in urology and hopefully can potentially give us some further insight or at least make sure that [facility] am not [facility].

## 2015-01-25 NOTE — Progress Notes (Addendum)
[facility]                                                                              Patient Demographics  Scott Nixon, is a 74 y.o. male, DOB - 06-13-1940, ZOX:[id number]  Admit date - 01/24/2015   Admitting Physician Clydie Braunondell A Smith, MD  Outpatient Primary MD for the patient is Scott Nixon,Scott THOMAS, MD  LOS -    Chief Complaint  Patient presents with  . Abnormal Lab  . Cough       Brief HPI    Per Dr. Katrinka BlazingSmith on 01/24/15 Scott Nixon is a 74 y.o. male sent by PCP to ED for evaluation of hematuria. In late October 2015 patient underwent CT scan for evaluation of abdominal pain. CT revealed sigmoid diverticulitis with probable colovesical fistula. Subsequent to that, a cystoscopy was done and also concerning for a colovesical fistula. A colonoscopy was arranged to be done around the same time as surgery to avoid two bowel preps. The colonoscopy 03/23/14 revealed severe left-sided diverticulosis and a submucosal lesion in cecum near the appendix. Patient taken to OR the following day where he underwent a laparoscopic left colectomy / appendectomy and repair of colovesical fistula.  Since surgery patient has had frequent urination (multiple times a day). He has also had intermittent hematuria and has chronic cystitis. Urology has been trying to figure out why the ongoing bleeding and voiding problems. No recurrent fistula on CTscan in June. Patient states a barium enema around that time was also negative for recurrent fistula. Bleeding cleared up in May following bladder irrigations. A week ago bleeding started back. No dysuria. Creatinine slowly rising over the last few months . Urology [facility]. Since surgery patient has lost 50 pounds, eight of those in the last several days. He developed a dry cough a week ago and poor appetite since then.    Assessment & Plan    Principal Problem: Acute on chronic renal failure with  metabolic acidosis (bicarb 10). Creatinine rising over last several months, chronic cystitis, dehydration. - Creatinine improving, 3.3 at the time of admission - Continue IV fluid hydration and bladder irrigation  Active problems Gross hematuria / pyuria, UTI. Known chronic cystitis. Urology following closely on outpatient basis , Dr. Isabel CapriceGrapey and [facility].  - EDP spoke to urology, recommended continuous bladder irrigation - Awaiting formal urology recommendations, hematuria has cleared, H&H stable - Follow urine culture and sensitivities, placed on IV Rocephin  Acute bronchitis - Per [place], has been having cough, shortness of breath - Placed on DuoNeb nebs, Tessalon, Robitussin, Zithromax IV  Microcytic anemia, likely secondary to chronic hematuria. Stable.  Bowel changes, recent diarrhea. -  C-diff negative at GI office several days ago. - Was taking Flaxseed for constipation.   Code Status: Full code  Family Communication: Discussed in detail with the patient, all imaging results, lab results explained to the patient and wife at the bedside   Disposition Plan:    Time Spent in minutes   25 minutes   Procedures  Bladder irrigation  Consults   Urology  DVT Prophylaxis SCD's  Medications  Scheduled Meds: . azithromycin  500 mg Intravenous Q24H  . benzonatate  100 mg Oral TID  . cefTRIAXone (ROCEPHIN)  IV  1 g Intravenous Q24H  . guaiFENesin  600 mg Oral BID  . Influenza vac split quadrivalent PF  0.5 mL Intramuscular Tomorrow-1000  . ipratropium-albuterol  3 mL Nebulization Q6H  . loratadine  10 mg Oral Daily  . pantoprazole  40 mg Oral Q0600  . predniSONE  40 mg Oral Q breakfast   Continuous Infusions: . sodium chloride 100 mL/hr at 01/25/15 0840   PRN Meds:.acetaminophen, hydrOXYzine, ondansetron **OR** ondansetron (ZOFRAN) IV, triamcinolone cream   Antibiotics   Anti-infectives    Start      Dose/Rate Route Frequency Ordered Stop   01/25/15 0900  azithromycin (ZITHROMAX) 500 mg in dextrose 5 % 250 mL IVPB     500 mg 250 mL/hr over 60 Minutes Intravenous Every 24 hours 01/25/15 0813     01/25/15 0830  cefTRIAXone (ROCEPHIN) 1 g in dextrose 5 % 50 mL IVPB     1 g 100 mL/hr over 30 Minutes Intravenous Every 24 hours 01/25/15 0813     01/24/15 1630  ciprofloxacin (CIPRO) tablet 500 mg     500 mg Oral  Once 01/24/15 1629 01/24/15 1656        Subjective:   Scott Nixon was seen and examined today. Patient reports hematuria has cleared. Coughing whole time during encounter, no fevers or chills. Feels congested. Patient denies dizziness, chest pain,  abdominal pain, N/V/D/C, new weakness, numbess, tingling. No acute events overnight.    Objective:   Blood pressure 128/72, pulse 72, temperature 98.2 F (36.8 C), temperature source Oral, resp. rate 18, height  (1.778 m), weight 95.709 kg (211 lb), SpO2 98 %.  Wt Readings from Last 3 Encounters:  01/24/15 95.709 kg (211 lb)  01/17/15 99.428 kg (219 lb 3.2 oz)  12/25/14 100.245 kg (221 lb)     Intake/Output Summary (Last 24 hours) at 01/25/15 1039 Last data filed at 01/25/15 1022  Gross per 24 hour  Intake  16109 ml  Output  15800 ml  Net  -3370 ml    Exam  General: Alert and oriented x 3, NAD  HEENT:  PERRLA, EOMI, Anicteric Sclera, mucous membranes moist.   Neck: Supple, no JVD, no masses  CVS: S1 S2 auscultated, no rubs, murmurs or gallops. Regular rate and rhythm.  Respiratory: Scattered rhonchi bilaterally  Abdomen: Soft, nontender, nondistended, + bowel sounds  Ext: no cyanosis clubbing or edema  Neuro: AAOx3, Cr N's II- XII. Strength 5/5 upper and lower extremities bilaterally  Skin: No rashes  Psych: Normal affect and demeanor, alert and oriented x3    Data Review   Micro Results Recent Results (from the past 240 hour(s))  Clostridium Difficile by PCR     Status: None   Collection Time:  01/18/15  9:08 AM  Result Value Ref Range Status   Toxigenic C Difficile by pcr Not Detected Not Detected Final    Comment: This test is for use only with liquid or soft stools; performance characteristics of other clinical specimen types have not been established.   This assay was performed by Cepheid GeneXpert(R) PCR. The performance characteristics of this assay have been determined by [employer]. Performance characteristics refer to the analytical performance of the test.   Urine culture     Status: None (Preliminary result)   Collection Time:  01/24/15  3:29 PM  Result Value Ref Range Status   Specimen Description URINE, CATHETERIZED  Final   Special Requests Normal  Final   Culture NO GROWTH < 12 HOURS  Final   Report Status PENDING  Incomplete    Radiology Reports Dg Chest 2 View  01/24/2015  CLINICAL DATA:  Acute cough and congestion for 1 week. EXAM: CHEST  2 VIEW COMPARISON:  01/18/2015 FINDINGS: Normal heart size and vascularity. Lungs remain clear. No focal pneumonia, collapse or consolidation. Negative for edema, effusion or pneumothorax. Trachea midline. Degenerative changes of the spine. No significant interval change. IMPRESSION: Stable chest exam without acute process. Electronically Signed   By: Judie Petit.  Shick M.D.   On: 01/24/2015 14:20   Dg Chest 2 View  01/18/2015  CLINICAL DATA:  Cough and congestion. EXAM: CHEST  2 VIEW COMPARISON:  03/17/2014. FINDINGS: Mediastinum and hilar structures normal. Questionable minimal right middle lobe subsegmental atelectasis and or infiltrate seen only on lateral view. No pleural effusion or pneumothorax. Heart size normal. No acute bony abnormality . IMPRESSION: Questionable minimal right middle lobe subsegmental atelectasis and/or infiltrate. This is seen only on lateral view . No other abnormality identified. Electronically Signed   By: Maisie Fus  Register   On: 01/18/2015 10:12    CBC  Recent Labs Lab 01/24/15 1349  01/25/15 0339  WBC 9.8 8.1  HGB 11.2* 10.0*  HCT 35.4* 31.6*  PLT 251 233  MCV 76.6* 76.0*  MCH 24.2* 24.0*  MCHC 31.6 31.6  RDW 18.6* 18.2*    Chemistries   Recent Labs Lab 01/24/15 1349 01/25/15 0339  NA 131* 130*  K 4.2 3.6  CL 106 107  CO2 10* 14*  GLUCOSE 177* 96  BUN 77* 65*  CREATININE 3.30* 2.51*  CALCIUM 9.2 8.4*  AST 17  --   ALT 17  --   ALKPHOS 71  --   BILITOT 0.7  --    ------------------------------------------------------------------------------------------------------------------ estimated creatinine clearance is 30 mL/min (by C-G formula based on Cr of 2.51). ------------------------------------------------------------------------------------------------------------------ No results for input(s): HGBA1C in the last 72 hours. ------------------------------------------------------------------------------------------------------------------ No results for input(s): CHOL, HDL, LDLCALC, TRIG, CHOLHDL, LDLDIRECT in the last 72 hours. ------------------------------------------------------------------------------------------------------------------ No results for input(s): TSH, T4TOTAL, T3FREE, THYROIDAB in the last 72 hours.  Invalid input(s): FREET3 ------------------------------------------------------------------------------------------------------------------ No results for input(s): VITAMINB12, FOLATE, FERRITIN, TIBC, IRON, RETICCTPCT in the last 72 hours.  Coagulation profile  Recent Labs Lab 01/24/15 1916  INR 1.12    No results for input(s): DDIMER in the last 72 hours.  Cardiac Enzymes No results for input(s): CKMB, TROPONINI, MYOGLOBIN in the last 168 hours.  Invalid input(s): CK ------------------------------------------------------------------------------------------------------------------ Invalid input(s): POCBNP   Recent Labs  01/25/15 1027  GLUCAP 143*     RAI,RIPUDEEP M.D. [facility] 01/25/2015, 10:39  AM  Pager: [phone] Between 7am to 7pm - call Pager - [phone]  After 7pm go to [email or web address] - password [email or web address]  Call night coverage person covering after 7pm

## 2015-01-26 ENCOUNTER — Observation Stay (HOSPITAL_COMMUNITY): Payer: Medicare Other

## 2015-01-26 DIAGNOSIS — N321 Vesicointestinal fistula: Secondary | ICD-10-CM | POA: Diagnosis not present

## 2015-01-26 DIAGNOSIS — N3020 Other chronic cystitis without hematuria: Secondary | ICD-10-CM | POA: Diagnosis not present

## 2015-01-26 DIAGNOSIS — N179 Acute kidney failure, unspecified: Secondary | ICD-10-CM | POA: Diagnosis not present

## 2015-01-26 DIAGNOSIS — K219 Gastro-esophageal reflux disease without esophagitis: Secondary | ICD-10-CM | POA: Diagnosis not present

## 2015-01-26 LAB — BASIC METABOLIC PANEL
Anion gap: 13 (ref 5–15)
Anion gap: 12 (ref 5–15)
BUN: 50 mg/dL — AB (ref 6–20)
BUN: 51 mg/dL — AB (ref 6–20)
CALCIUM: 9 mg/dL (ref 8.9–10.3)
CALCIUM: 8.3 mg/dL — AB (ref 8.9–10.3)
CO2: 14 mmol/L — ABNORMAL LOW (ref 22–32)
CO2: 12 mmol/L — ABNORMAL LOW (ref 22–32)
Chloride: 109 mmol/L (ref 101–111)
Chloride: 106 mmol/L (ref 101–111)
Creatinine, Ser: 2.3 mg/dL — ABNORMAL HIGH (ref 0.61–1.24)
Creatinine, Ser: 2.55 mg/dL — ABNORMAL HIGH (ref 0.61–1.24)
GFR calc Af Amer: 30 mL/min — ABNORMAL LOW (ref >60)
GFR calc Af Amer: 27 mL/min — ABNORMAL LOW (ref >60)
GFR, EST NON AFRICAN AMERICAN: 26 mL/min — AB (ref >60)
GFR, EST NON AFRICAN AMERICAN: 23 mL/min — AB (ref >60)
GLUCOSE: 153 mg/dL — AB (ref 65–99)
GLUCOSE: 262 mg/dL — AB (ref 65–99)
Potassium: 3.9 mmol/L (ref 3.5–5.1)
Potassium: 4.4 mmol/L (ref 3.5–5.1)
Sodium: 136 mmol/L (ref 135–145)
Sodium: 130 mmol/L — ABNORMAL LOW (ref 135–145)

## 2015-01-26 LAB — URINE CULTURE: Special Requests: NORMAL

## 2015-01-26 LAB — CBC
HCT: 30.6 % — ABNORMAL LOW (ref 39.0–52.0)
Hemoglobin: 9.7 g/dL — ABNORMAL LOW (ref 13.0–17.0)
MCH: 24 pg — AB (ref 26.0–34.0)
MCHC: 31.7 g/dL (ref 30.0–36.0)
MCV: 75.6 fL — AB (ref 78.0–100.0)
PLATELETS: 263 10*3/uL (ref 150–400)
RBC: 4.05 MIL/uL — ABNORMAL LOW (ref 4.22–5.81)
RDW: 18.6 % — AB (ref 11.5–15.5)
WBC: 6.8 10*3/uL (ref 4.0–10.5)

## 2015-01-26 MED ORDER — POLYETHYLENE GLYCOL 3350 17 G PO PACK
17.0000 g | PACK | Freq: Every day | ORAL | Status: DC
  Administered 2015-01-26: 17 g via ORAL
  Filled 2015-01-26: qty 1

## 2015-01-26 MED ORDER — LEVOFLOXACIN 500 MG PO TABS: 500.0000 mg | ORAL_TABLET | ORAL | Status: DC

## 2015-01-26 MED ORDER — ALBUTEROL SULFATE HFA 108 (90 BASE) MCG/ACT IN AERS: 2.0000 | INHALATION_SPRAY | Freq: Four times a day (QID) | RESPIRATORY_TRACT | Status: AC | PRN

## 2015-01-26 MED ORDER — PREDNISONE 5 MG PO TABS: 5.0000 mg | ORAL_TABLET | Freq: Every day | ORAL | Status: DC

## 2015-01-26 MED ORDER — BENZONATATE 100 MG PO CAPS: 100.0000 mg | ORAL_CAPSULE | Freq: Three times a day (TID) | ORAL | Status: AC | PRN

## 2015-01-26 MED ORDER — GUAIFENESIN ER 600 MG PO TB12: 600.0000 mg | ORAL_TABLET | Freq: Two times a day (BID) | ORAL | Status: AC

## 2015-01-26 MED ORDER — PREDNISONE 10 MG PO TABS: ORAL_TABLET | ORAL | Status: DC

## 2015-01-26 MED ORDER — PANTOPRAZOLE SODIUM 40 MG PO TBEC: 40.0000 mg | DELAYED_RELEASE_TABLET | Freq: Every day | ORAL | Status: DC

## 2015-01-26 MED ORDER — AZITHROMYCIN 500 MG PO TABS: 500.0000 mg | ORAL_TABLET | Freq: Every day | ORAL | Status: DC

## 2015-01-26 MED ORDER — POLYETHYLENE GLYCOL 3350 17 G PO PACK: 17.0000 g | PACK | Freq: Every day | ORAL | Status: AC | PRN

## 2015-01-26 NOTE — Progress Notes (Signed)
[facility]                                                                              Patient Demographics  Scott Nixon, is a 74 y.o. male, DOB - [birth year], ZOX:[id number]  Admit date - 01/24/2015   Admitting Physician Clydie Braun, MD  Outpatient Primary MD for the patient is Ginette Otto, MD  LOS -    Chief Complaint  Patient presents with  . Abnormal Lab  . Cough       Brief HPI    Per Dr. Katrinka Blazing on 01/24/15 Scott Nixon is a 74 y.o. male sent by PCP to ED for evaluation of hematuria. In late October 2015 patient underwent CT scan for evaluation of abdominal pain. CT revealed sigmoid diverticulitis with probable colovesical fistula. Subsequent to that, a cystoscopy was done and also concerning for a colovesical fistula. A colonoscopy was arranged to be done around the same time as surgery to avoid two bowel preps. The colonoscopy 03/23/14 revealed severe left-sided diverticulosis and a submucosal lesion in cecum near the appendix. Patient taken to OR the following day where he underwent a laparoscopic left colectomy / appendectomy and repair of colovesical fistula.  Since surgery patient has had frequent urination (multiple times a day). He has also had intermittent hematuria and has chronic cystitis. Urology has been trying to figure out why the ongoing bleeding and voiding problems. No recurrent fistula on CTscan in June. Patient states a barium enema around that time was also negative for recurrent fistula. Bleeding cleared up in May following bladder irrigations. A week ago bleeding started back. No dysuria. Creatinine slowly rising over the last few months . Urology referring him to another Urologist at [facility]. Since surgery patient has lost 50 pounds, eight of those in the last several days. He developed a dry cough a week ago and poor appetite since then.    Assessment & Plan    Principal Problem: Acute on chronic renal failure with  metabolic acidosis (bicarb 10). Creatinine rising over last several months, chronic cystitis, dehydration. - Creatinine 3.3 at the time of admission - Continue IV fluid hydration, repeat Cr 2.5 worse from this AM, will continue IVF overnight, recheck in AM - obtain renal ultrasound, I also discussed with Dr Lacy Duverney this am, patient will follow with Dr Allena Katz outpatient for management of CKD   Active problems Gross hematuria / pyuria, UTI. Known chronic cystitis. Urology following closely on outpatient basis , Dr. Isabel Caprice and referring him to a specialist at [facility] soon.  - resolved, CBI stopped today, foley discontinued  -  on IV Rocephin, urine cultures negative - appreciate urology recommendations, patient has urology follow-up on Monday at [facility]  Acute bronchitis- improving - Per patient just returned from [place], has been having cough, shortness of breath - Placed on DuoNeb nebs, Tessalon, Robitussin, Zithromax IV  Microcytic anemia, likely secondary to chronic hematuria. Stable.  Bowel changes, recent diarrhea. -  C-diff negative at GI office several days ago. - Was taking Flaxseed for constipation.   Code Status: Full code  Family Communication: Discussed in detail with the patient,  all imaging results, lab results explained to the patient and wife at the bedside   Disposition Plan:  Monitor cr in AM  Time Spent in minutes   25 minutes   Procedures  Bladder irrigation  Consults   Urology  DVT Prophylaxis SCD's  Medications  Scheduled Meds: . [START ON 01/27/2015] azithromycin  500 mg Oral Daily  . benzonatate  100 mg Oral TID  . cefTRIAXone (ROCEPHIN)  IV  1 g Intravenous Q24H  . guaiFENesin  600 mg Oral BID  . ipratropium-albuterol  3 mL Nebulization Q6H  . loratadine  10 mg Oral Daily  . pantoprazole  40 mg Oral Q0600  . polyethylene glycol  17 g Oral Daily  . predniSONE  40 mg Oral Q breakfast   Continuous Infusions: . sodium chloride 125 mL/hr  at 01/26/15 1612   PRN Meds:.acetaminophen, hydrOXYzine, ondansetron **OR** ondansetron (ZOFRAN) IV, triamcinolone cream   Antibiotics   Anti-infectives    Start     Dose/Rate Route Frequency Ordered Stop   01/27/15 1000  azithromycin (ZITHROMAX) tablet 500 mg     500 mg Oral Daily 01/26/15 1056     01/27/15 0000  levofloxacin (LEVAQUIN) 500 MG tablet     500 mg Oral Every other day 01/26/15 1237     01/25/15 0900  azithromycin (ZITHROMAX) 500 mg in dextrose 5 % 250 mL IVPB  Status:  Discontinued     500 mg 250 mL/hr over 60 Minutes Intravenous Every 24 hours 01/25/15 0813 01/26/15 1056   01/25/15 0830  cefTRIAXone (ROCEPHIN) 1 g in dextrose 5 % 50 mL IVPB     1 g 100 mL/hr over 30 Minutes Intravenous Every 24 hours 01/25/15 0813     01/24/15 1630  ciprofloxacin (CIPRO) tablet 500 mg     500 mg Oral  Once 01/24/15 1629 01/24/15 1656        Subjective:   Scott Nixon was seen and examined today. Patient reports hematuria has cleared. Coughing improving. Patient denies dizziness, chest pain,  abdominal pain, N/V/D/C, new weakness, numbess, tingling. No acute events overnight.    Objective:   Blood pressure 138/64, pulse 93, temperature 97.7 F (36.5 C), temperature source Oral, resp. rate 18, height 5\' 10"  (1.778 m), weight 96.798 kg (213 lb 6.4 oz), SpO2 98 %.  Wt Readings from Last 3 Encounters:  01/25/15 96.798 kg (213 lb 6.4 oz)  01/17/15 99.428 kg (219 lb 3.2 oz)  12/25/14 100.245 kg (221 lb)     Intake/Output Summary (Last 24 hours) at 01/26/15 1730 Last data filed at 01/26/15 1500  Gross per 24 hour  Intake    125 ml  Output  [id number] ml  Net -13505 ml    Exam  General: Alert and oriented x 3, NAD  HEENT:  PERRLA, EOMI, Anicteric Sclera, mucous membranes moist.   Neck: Supple, no JVD, no masses  CVS: S1 S2 auscultated, no rubs, murmurs or gallops. Regular rate and rhythm.  Respiratory: fairly clear to auscultation   Abdomen: Soft, nontender,  nondistended, + bowel sounds  Ext: no cyanosis clubbing or edema  Neuro: no new deficits  Skin: No rashes  Psych: Normal affect and demeanor, alert and oriented x3    Data Review   Micro Results Recent Results (from the past 240 hour(s))  Clostridium Difficile by PCR     Status: None   Collection Time: 01/18/15  9:08 AM  Result Value Ref Range Status   Toxigenic C Difficile by pcr Not Detected  Not Detected Final    Comment: This test is for use only with liquid or soft stools; performance characteristics of other clinical specimen types have not been established.   This assay was performed by Cepheid GeneXpert(R) PCR. The performance characteristics of this assay have been determined by [employer]. Performance characteristics refer to the analytical performance of the test.   Urine culture     Status: None   Collection Time: 01/24/15  3:29 PM  Result Value Ref Range Status   Specimen Description URINE, CATHETERIZED  Final   Special Requests Normal  Final   Culture MULTIPLE SPECIES PRESENT, SUGGEST RECOLLECTION  Final   Report Status 01/26/2015 FINAL  Final    Radiology Reports Dg Chest 2 View  01/24/2015  CLINICAL DATA:  Acute cough and congestion for 1 week. EXAM: CHEST  2 VIEW COMPARISON:  01/18/2015 FINDINGS: Normal heart size and vascularity. Lungs remain clear. No focal pneumonia, collapse or consolidation. Negative for edema, effusion or pneumothorax. Trachea midline. Degenerative changes of the spine. No significant interval change. IMPRESSION: Stable chest exam without acute process. Electronically Signed   By: Judie Petit.  Shick M.D.   On: 01/24/2015 14:20   Dg Chest 2 View  01/18/2015  CLINICAL DATA:  Cough and congestion. EXAM: CHEST  2 VIEW COMPARISON:  03/17/2014. FINDINGS: Mediastinum and hilar structures normal. Questionable minimal right middle lobe subsegmental atelectasis and or infiltrate seen only on lateral view. No pleural effusion or pneumothorax.  Heart size normal. No acute bony abnormality . IMPRESSION: Questionable minimal right middle lobe subsegmental atelectasis and/or infiltrate. This is seen only on lateral view . No other abnormality identified. Electronically Signed   By: Maisie Fus  Register   On: 01/18/2015 10:12    CBC  Recent Labs Lab 01/24/15 1349 01/25/15 0339 01/26/15 0451  WBC 9.8 8.1 6.8  HGB 11.2* 10.0* 9.7*  HCT 35.4* 31.6* 30.6*  PLT 251 233 263  MCV 76.6* 76.0* 75.6*  MCH 24.2* 24.0* 24.0*  MCHC 31.6 31.6 31.7  RDW 18.6* 18.2* 18.6*    Chemistries   Recent Labs Lab 01/24/15 1349 01/25/15 0339 01/26/15 0451 01/26/15 1545  NA 131* 130* 136 130*  K 4.2 3.6 3.9 4.4  CL 106 107 109 106  CO2 10* 14* 14* 12*  GLUCOSE 177* 96 153* 262*  BUN 77* 65* 50* 51*  CREATININE 3.30* 2.51* 2.30* 2.55*  CALCIUM 9.2 8.4* 9.0 8.3*  AST 17  --   --   --   ALT 17  --   --   --   ALKPHOS 71  --   --   --   BILITOT 0.7  --   --   --    ------------------------------------------------------------------------------------------------------------------ estimated creatinine clearance is 29.7 mL/min (by C-G formula based on Cr of 2.55). ------------------------------------------------------------------------------------------------------------------ No results for input(s): HGBA1C in the last 72 hours. ------------------------------------------------------------------------------------------------------------------ No results for input(s): CHOL, HDL, LDLCALC, TRIG, CHOLHDL, LDLDIRECT in the last 72 hours. ------------------------------------------------------------------------------------------------------------------ No results for input(s): TSH, T4TOTAL, T3FREE, THYROIDAB in the last 72 hours.  Invalid input(s): FREET3 ------------------------------------------------------------------------------------------------------------------ No results for input(s): VITAMINB12, FOLATE, FERRITIN, TIBC, IRON, RETICCTPCT in the  last 72 hours.  Coagulation profile  Recent Labs Lab 01/24/15 1916  INR 1.12    No results for input(s): DDIMER in the last 72 hours.  Cardiac Enzymes No results for input(s): CKMB, TROPONINI, MYOGLOBIN in the last 168 hours.  Invalid input(s): CK ------------------------------------------------------------------------------------------------------------------ Invalid input(s): POCBNP   Recent Labs  01/25/15 1027  GLUCAP 143*  Evon Lopezperez M.D. [facility] 01/26/2015, 5:30 PM  Pager: [phone] Between 7am to 7pm - call Pager - [phone]  After 7pm go to [email or web address] - password [email or web address]  Call night coverage person covering after 7pm

## 2015-01-26 NOTE — Progress Notes (Signed)
Pt's foley removed by student nurse with no complications. Pt tolerated well. Will continue to monitor.

## 2015-01-26 NOTE — Progress Notes (Signed)
Spoke to the NP on call Jetta LoutDiane Warden for orders on continuous bladder irrigation. NP to follow up in the morning

## 2015-01-26 NOTE — Evaluation (Signed)
Physical Therapy Evaluation Patient Details Name: Scott Nixon MRN: [id number] DOB: 1940-06-27 Today's Date: 01/26/2015   History of Present Illness  KENDRICKS REAP is a 74 y.o. male with a PMH significant for diabetes mellitus type 2, sigmoid diverticulitis, colovesical fistula, and cystitis; who presents for difficulty affording and blood in his urine. Patient has required irrigation in the past to clear bleeding. Urology consulted to assist in management.  Clinical Impression  Pt admitted with above diagnosis. Pt currently with functional limitations due to the deficits listed below (see PT Problem List). Pt ambulated without device with some LOB and may benefit from RW or cane initially at home.  Pt aware.  Will also possibly need HHPt f/u.  Will follow acutely.  Pt will benefit from skilled PT to increase their independence and safety with mobility to allow discharge to the venue listed below.      Follow Up Recommendations Home health PT;Supervision/Assistance - 24 hour    Equipment Recommendations  Rolling walker with 5" wheels (versus cane - pt to decide)    Recommendations for Other Services       Precautions / Restrictions Precautions Precautions: Fall Restrictions Weight Bearing Restrictions: No      Mobility  Bed Mobility Overal bed mobility: Independent                Transfers Overall transfer level: Independent                  Ambulation/Gait Ambulation/Gait assistance: Supervision;Min guard Ambulation Distance (Feet): 250 Feet Assistive device: 1 person hand held assist;None (held to IV pole) Gait Pattern/deviations: Step-through pattern;Decreased stride length;Drifts right/left   Gait velocity interpretation: Below normal speed for age/gender General Gait Details: Pt initially wanting to hold IV pole.  then ambulated with HHA.  Pt eventually able to ambulate without UE support with occasional drifting gait but not significant enough for  LOB.  However no challenges to balance given.  Pt aware of balance issue and states his girlfriend will assist him and he can get a cane or RW if he needs to.    Stairs            Wheelchair Mobility    Modified Rankin (Stroke Patients Only)       Balance Overall balance assessment: Needs assistance         Standing balance support: No upper extremity supported;During functional activity Standing balance-Leahy Scale: Fair Standing balance comment: can stand statically without UE support but cannot withstand challenges to balance.                              Pertinent Vitals/Pain Pain Assessment: No/denies pain  VSS    Home Living Family/patient expects to be discharged to:: Private residence Living Arrangements: Spouse/significant other Available Help at Discharge: Family;Friend(s);Available 24 hours/day Type of Home: House Home Access: Stairs to enter Entrance Stairs-Rails: None Entrance Stairs-Number of Steps: 3 Home Layout: One level Home Equipment: None      Prior Function Level of Independence: Independent               Hand Dominance        Extremity/Trunk Assessment   Upper Extremity Assessment: Defer to OT evaluation           Lower Extremity Assessment: Generalized weakness      Cervical / Trunk Assessment: Normal  Communication   Communication: No difficulties  Cognition Arousal/Alertness: Awake/alert Behavior  During Therapy: WFL for tasks assessed/performed Overall Cognitive Status: Within Functional Limits for tasks assessed                      General Comments      Exercises        Assessment/Plan    PT Assessment Patient needs continued PT services  PT Diagnosis Generalized weakness   PT Problem List Decreased activity tolerance;Decreased balance;Decreased mobility;Decreased knowledge of use of DME;Decreased safety awareness;Decreased knowledge of precautions  PT Treatment Interventions DME  instruction;Gait training;Functional mobility training;Stair training;Therapeutic activities;Therapeutic exercise;Balance training;Patient/family education   PT Goals (Current goals can be found in the Care Plan section) Acute Rehab PT Goals Patient Stated Goal: to go home PT Goal Formulation: With patient Time For Goal Achievement: 02/02/15 Potential to Achieve Goals: Good    Frequency Min 3X/week   Barriers to discharge        Co-evaluation               End of Session Equipment Utilized During Treatment: Gait belt Activity Tolerance: Patient limited by fatigue Patient left: in chair;with call bell/phone within reach Nurse Communication: Mobility status    Functional Assessment Tool Used: clinical judgement Functional Limitation: Mobility: Walking and moving around Mobility: Walking and Moving Around Current Status [phone](G8978): At least 1 percent but less than 20 percent impaired, limited or restricted Mobility: Walking and Moving Around Goal Status [phone](G8979): At least 1 percent but less than 20 percent impaired, limited or restricted    Time: 1458-1510 PT Time Calculation (min) (ACUTE ONLY): 12 min   Charges:   PT Evaluation $Initial PT Evaluation Tier I: 1 Procedure     PT G Codes:   PT G-Codes **NOT FOR INPATIENT CLASS** Functional Assessment Tool Used: clinical judgement Functional Limitation: Mobility: Walking and moving around Mobility: Walking and Moving Around Current Status ([id number]): At least 1 percent but less than 20 percent impaired, limited or restricted Mobility: Walking and Moving Around Goal Status [phone](G8979): At least 1 percent but less than 20 percent impaired, limited or restricted    Berline LopesWhite, Joeline Freer F 01/26/2015, 5:25 PM Rola Lennon,PT Acute Rehabilitation [phone])

## 2015-01-26 NOTE — Discharge Summary (Signed)
Physician Discharge Summary   Patient ID: Scott Nixon MRN: [id number] DOB/AGE: 74-14-1942 74 y.o.  Admit date: 01/24/2015 Discharge date: 01/27/2015  Primary Care Physician:  Ginette Otto, MD  Discharge Diagnoses:      Nixon with underlying history of chronic cystitis  . AKI (acute kidney injury) (HCC) . Acute bronchitis  . Metabolic acidosis . Acute on chronic renal failure (HCC) . GERD . Microcytic anemia . Chronic cystitis . 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 Vernie Ammons   Recommendations for Outpatient Follow-up:  Please follow BMET at the time of appointment  I have made him an appointment with Dr. Allena Katz, [facility] on 02/12/15 to follow up on the chronic kidney disease.  Patient reports that he has follow-up appointment with Dr. Gala Lewandowsky at  [facility] on 01/29/15  TESTS THAT NEED FOLLOW-UP BMET   DIET: Heart healthy diet    Allergies:   Allergies  Allergen Reactions  . Other     Unknown medication that caused severe itching and scaling of hands and feet --  During admission Dec 2015  . Primidone Other (See Comments)    Lethargy  . Statins   . Oxycodone Rash    Rash and itching      Discharge Medications:   Medication List    TAKE these medications        acetaminophen 500 MG tablet  Commonly known as:  TYLENOL  Take 500 mg by mouth every 6 (six) hours as needed for moderate pain or headache.     albuterol 108 (90 BASE) MCG/ACT inhaler  Commonly known as:  PROVENTIL HFA;VENTOLIN HFA  Inhale 2 puffs into the lungs every 6 (six) hours as needed for wheezing or shortness of breath.     benzonatate 100 MG capsule  Commonly known as:  TESSALON  Take 1 capsule (100 mg total) by mouth 3 (three) times daily as needed for cough.     cetirizine 10 MG tablet  Commonly known as:  ZYRTEC  Take 10 mg by mouth daily.     guaiFENesin 600 MG 12 hr tablet  Commonly known  as:  MUCINEX  Take 1 tablet (600 mg total) by mouth 2 (two) times daily.     hydrOXYzine 10 MG tablet  Commonly known as:  ATARAX/VISTARIL  Take 10 mg by mouth daily as needed for itching. Take 1 to 3 tablets at bedtime as needed     levofloxacin 500 MG tablet  Commonly known as:  LEVAQUIN  Take 1 tablet (500 mg total) by mouth every other day. X 7 doses  Start taking on:  01/28/2015     pantoprazole 40 MG tablet  Commonly known as:  PROTONIX  Take 1 tablet (40 mg total) by mouth daily.     polyethylene glycol packet  Commonly known as:  MIRALAX / GLYCOLAX  Take 17 g by mouth daily as needed for mild constipation.     predniSONE 5 MG tablet  Commonly known as:  DELTASONE  Take 1 tablet (5 mg total) by mouth daily. Resume once prednisone taper is complete     predniSONE 10 MG tablet  Commonly known as:  DELTASONE  Prednisone dosing: Take  Prednisone  (4 tabs) x1 days, then taper to  (3 tabs) x 3 days, then  (2 tabs) x 3days, then  (1 tab) x 3days, then continue your maintenance dose of 5 mg  Start taking on:  01/28/2015  REFRESH OP  Place 1 drop into both eyes daily as needed (dry eyes).     traMADol 50 MG tablet  Commonly known as:  ULTRAM  Take 1 tablet (50 mg total) by mouth every 8 (eight) hours as needed for severe pain.     triamcinolone cream 0.1 %  Commonly known as:  KENALOG  Apply 1 application topically daily as needed. For rash         Brief H and P: For complete details please refer to admission H and P, but in briefPer Dr. Katrinka BlazingSmith on 01/24/15 Scott Nixon. In late October 2015 patient underwent CT scan for evaluation of abdominal pain. CT revealed sigmoid diverticulitis with probable colovesical fistula. Subsequent to that, a cystoscopy was done and also concerning for a colovesical fistula. A colonoscopy was arranged to be done around the same time as surgery to avoid two  bowel preps. The colonoscopy 03/23/14 revealed severe left-sided diverticulosis and a submucosal lesion in cecum near the appendix. Patient taken to OR the following day where he underwent a laparoscopic left colectomy / appendectomy and repair of colovesical fistula.  Since surgery patient has had frequent urination (multiple times a day). He has also had intermittent Nixon and has chronic cystitis. Urology has been trying to figure out why the ongoing bleeding and voiding problems. No recurrent fistula on CTscan in June. Patient states a barium enema around that time was also negative for recurrent fistula. Bleeding cleared up in May following bladder irrigations. A week ago bleeding started back. No dysuria. Creatinine slowly rising over the last few months . Urology referring [facility]. Since surgery patient has lost 50 pounds, eight of those in the last several days. He developed a dry cough a week ago and poor appetite since then.  Hospital Course:   Acute on chronic renal failure with metabolic acidosis (bicarb 10). Creatinine rising over last several months, chronic cystitis, dehydration.  Creatinine 3.3 at the time of admission, 2.2 at the time of discharge - Patient was placed on IV fluid hydration. Creatinine function has been improving. The patient also has history of chronic kidney disease, creatinine has been rising over the last few months. I discussed in detail with Dr. Kathrene BongoGoldsborough from [facility]. Patient is accepted as a referral for monitoring chronic kidney disease and further workup, patient has follow-up appointment with Dr. Zetta BillsJay Patel on 02/12/15. Renal ultrasound showed no hydronephrosis however possible small amount of left perinephric fluid or edema. Please obtain BMET at the time of appointment.   Gross Nixon / pyuria, UTI. Known chronic cystitis. Urology following closely on outpatient basis , Dr. Isabel CapriceGrapey and [facility]. He was placed on continuous bladder irrigation. Urology consult was obtained. Please note detailed consult note by Dr. Vernie Ammonsttelin. Nixon has cleared. H&H remained stable. Continuous bladder irrigation was discontinued, Foley catheter was discontinued. Urine culture and sensitivities however were negative. Patient was placed on IV Rocephin during hospitalization. He was transitioned to oral Levaquin to cover for both acute bronchitis and complicated UTI. [facility] on 01/29/15 with Dr Gala LewandowskyBadlani.  Acute bronchitis - Per patient, [place], had been having cough, shortness of breath. Patient was placed on DuoNeb's, Tessalon, Robitussin and Zithromax. He was transitioned to oral Levaquin with prednisone taper, albuterol inhaler the time of discharge.  Microcytic anemia,  likely secondary to chronic Nixon. Stable.  Bowel changes, recent diarrhea. - C-diff negative at GI office several days ago. - Was taking Flaxseed for constipation.    Day of Discharge BP 134/59 mmHg  Pulse 78  Temp(Src) 97.7 F (36.5 C) (Oral)  Resp 18  Ht 5\' 10"  (1.778 m)  Wt 97.3 kg (214 lb 8.1 oz)  BMI 30.78 kg/m2  SpO2 99%  Physical Exam: General: Alert and awake oriented x3 not in any acute distress. HEENT: anicteric sclera, pupils reactive to light and accommodation CVS: S1-S2 clear no murmur rubs or gallops Chest: clear to auscultation bilaterally, no wheezing rales or rhonchi Abdomen: soft nontender, nondistended, normal bowel sounds Extremities: no cyanosis, clubbing or edema noted bilaterally Neuro: Cranial nerves II-XII intact, no focal neurological deficits   The results of significant diagnostics from this hospitalization (including imaging, microbiology, ancillary and laboratory) are listed below for reference.    LAB RESULTS: Basic Metabolic Panel:  Recent Labs Lab 01/26/15 1545 01/27/15 0600  NA 130* 131*  K 4.4  3.9  CL 106 111  CO2 12* 14*  GLUCOSE 262* 128*  BUN 51* 49*  CREATININE 2.55* 2.25*  CALCIUM 8.3* 8.2*   Liver Function Tests:  Recent Labs Lab 01/24/15 1349  AST 17  ALT 17  ALKPHOS 71  BILITOT 0.7  PROT 7.7  ALBUMIN 3.1*    Recent Labs Lab 01/24/15 1349  LIPASE 48   No results for input(s): AMMONIA in the last 168 hours. CBC:  Recent Labs Lab 01/26/15 0451 01/27/15 0600  WBC 6.8 8.5  HGB 9.7* 9.0*  HCT 30.6* 28.4*  MCV 75.6* 76.5*  PLT 263 247   Cardiac Enzymes: No results for input(s): CKTOTAL, CKMB, CKMBINDEX, TROPONINI in the last 168 hours. BNP: Invalid input(s): POCBNP CBG:  Recent Labs Lab 01/25/15 1027  GLUCAP 143*    Significant Diagnostic Studies:  Dg Chest 2 View  01/24/2015  CLINICAL DATA:  Acute cough and congestion for 1 week. EXAM: CHEST  2 VIEW COMPARISON:  01/18/2015 FINDINGS: Normal heart size and vascularity. Lungs remain clear. No focal pneumonia, collapse or consolidation. Negative for edema, effusion or pneumothorax. Trachea midline. Degenerative changes of the spine. No significant interval change. IMPRESSION: Stable chest exam without acute process. Electronically Signed   By: Judie Petit.  Shick M.D.   On: 01/24/2015 14:20    2D ECHO:   Disposition and Follow-up: Discharge Instructions    Diet Carb Modified    Complete by:  As directed      Increase activity slowly    Complete by:  As directed             DISPOSITION: Home   DISCHARGE FOLLOW-UP Follow-up Information    Follow up with Ginette Otto, MD In 10 days.   Specialty:  Internal Medicine   Why:  for hospital follow-up, obtain labs bmet   Contact information:   [address] [place] [address] [phone]       Follow up with Dagoberto Ligas., MD. Nyra Capes on 02/12/2015.   Specialty:  Nephrology   Why:  at 10:45 AM. Please arrive 15 minutes early for the paperwork.   Contact information:   [address] [place] [address] [phone]        Follow up with Cherly Anderson, MD. Go on 01/29/2015.   Specialty:  Urology   Why:  for hospital follow-up   Contact information:   [address] [place] [address] [phone]  Time spent on Discharge: 35 minutes   Signed:   RAI,RIPUDEEP M.D. [facility] 01/27/2015, 11:04 AM Pager: [phone]

## 2015-01-27 DIAGNOSIS — N3020 Other chronic cystitis without hematuria: Secondary | ICD-10-CM | POA: Diagnosis not present

## 2015-01-27 DIAGNOSIS — H02839 Dermatochalasis of unspecified eye, unspecified eyelid: Secondary | ICD-10-CM

## 2015-01-27 DIAGNOSIS — K219 Gastro-esophageal reflux disease without esophagitis: Secondary | ICD-10-CM | POA: Diagnosis not present

## 2015-01-27 DIAGNOSIS — N321 Vesicointestinal fistula: Secondary | ICD-10-CM | POA: Diagnosis not present

## 2015-01-27 DIAGNOSIS — N179 Acute kidney failure, unspecified: Secondary | ICD-10-CM | POA: Diagnosis not present

## 2015-01-27 LAB — CBC
HEMATOCRIT: 28.4 % — AB (ref 39.0–52.0)
Hemoglobin: 9 g/dL — ABNORMAL LOW (ref 13.0–17.0)
MCH: 24.3 pg — ABNORMAL LOW (ref 26.0–34.0)
MCHC: 31.7 g/dL (ref 30.0–36.0)
MCV: 76.5 fL — ABNORMAL LOW (ref 78.0–100.0)
PLATELETS: 247 10*3/uL (ref 150–400)
RBC: 3.71 MIL/uL — ABNORMAL LOW (ref 4.22–5.81)
RDW: 18.7 % — AB (ref 11.5–15.5)
WBC: 8.5 10*3/uL (ref 4.0–10.5)

## 2015-01-27 LAB — BASIC METABOLIC PANEL
ANION GAP: 6 (ref 5–15)
BUN: 49 mg/dL — ABNORMAL HIGH (ref 6–20)
CALCIUM: 8.2 mg/dL — AB (ref 8.9–10.3)
CO2: 14 mmol/L — AB (ref 22–32)
Chloride: 111 mmol/L (ref 101–111)
Creatinine, Ser: 2.25 mg/dL — ABNORMAL HIGH (ref 0.61–1.24)
GFR, EST AFRICAN AMERICAN: 31 mL/min — AB (ref >60)
GFR, EST NON AFRICAN AMERICAN: 27 mL/min — AB (ref >60)
Glucose, Bld: 128 mg/dL — ABNORMAL HIGH (ref 65–99)
Potassium: 3.9 mmol/L (ref 3.5–5.1)
Sodium: 131 mmol/L — ABNORMAL LOW (ref 135–145)

## 2015-01-27 MED ORDER — IPRATROPIUM-ALBUTEROL 0.5-2.5 (3) MG/3ML IN SOLN
3.0000 mL | Freq: Two times a day (BID) | RESPIRATORY_TRACT | Status: DC
  Administered 2015-01-27: 3 mL via RESPIRATORY_TRACT
  Filled 2015-01-27: qty 3

## 2015-01-27 MED ORDER — TRAMADOL HCL 50 MG PO TABS: 50.0000 mg | ORAL_TABLET | Freq: Three times a day (TID) | ORAL | Status: DC | PRN

## 2015-01-27 MED ORDER — TRAMADOL HCL 50 MG PO TABS: 50.0000 mg | ORAL_TABLET | Freq: Three times a day (TID) | ORAL | Status: AC | PRN

## 2015-01-27 MED ORDER — PREDNISONE 5 MG PO TABS: 5.0000 mg | ORAL_TABLET | Freq: Every day | ORAL | Status: DC

## 2015-01-27 MED ORDER — PREDNISONE 10 MG PO TABS: ORAL_TABLET | ORAL | Status: AC

## 2015-01-27 MED ORDER — PREDNISONE 5 MG PO TABS: 5.0000 mg | ORAL_TABLET | Freq: Every day | ORAL | Status: AC

## 2015-01-27 MED ORDER — PANTOPRAZOLE SODIUM 40 MG PO TBEC: 40.0000 mg | DELAYED_RELEASE_TABLET | Freq: Every day | ORAL | Status: AC

## 2015-01-27 MED ORDER — PREDNISONE 10 MG PO TABS: ORAL_TABLET | ORAL | Status: DC

## 2015-01-27 MED ORDER — LEVOFLOXACIN 500 MG PO TABS: 500.0000 mg | ORAL_TABLET | ORAL | Status: AC

## 2015-01-27 NOTE — Progress Notes (Signed)
Patient discharged papers were printed,explained and given to patient and his significant other.Reminded of their pending medical appointment.Explained list of medicines especially his prednisone and antibiotic,how to take it and its side effects.Quesntions were answered satisfactorily at the time of discharged.

## 2015-01-31 LAB — PANCREATIC ELASTASE, FECAL: Pancreatic Elastase-1, Stool: >500 mcg/g

## 2015-02-07 NOTE — Progress Notes (Signed)
Agree w/ Ms. Hvozdovic's note and mangement.  

## 2015-03-05 ENCOUNTER — Encounter: Payer: Self-pay | Admitting: Podiatry

## 2015-03-05 ENCOUNTER — Ambulatory Visit (INDEPENDENT_AMBULATORY_CARE_PROVIDER_SITE_OTHER): Payer: Medicare Other | Admitting: Podiatry

## 2015-03-05 VITALS — BP 159/80 | HR 76 | Resp 16

## 2015-03-05 DIAGNOSIS — M7662 Achilles tendinitis, left leg: Secondary | ICD-10-CM | POA: Diagnosis not present

## 2015-03-05 MED ORDER — TRIAMCINOLONE ACETONIDE 10 MG/ML IJ SUSP
10.0000 mg | Freq: Once | INTRAMUSCULAR | Status: AC
  Administered 2015-03-05: 10 mg

## 2015-03-06 NOTE — Progress Notes (Signed)
Subjective:     Patient ID: Scott Nixon, male   DOB: Jan 20, 1941, 74 y.o.   MRN: [id number]  HPI patient states that my left Achilles tendon has been bothering me again and I am due to have my bladder taken out next week and I don't want to be and pain   Review of Systems     Objective:   Physical Exam Neurovascular status intact muscle strength adequate with exquisite discomfort on the lateral side of the Achilles tendon with his central and medial being in good condition    Assessment:     Acute Achilles tendinitis left    Plan:     Reviewed utilization of injection therapy and explain risk and he wants surgery understanding chances for rupture of the Achilles tendon. Did a careful injection of the lateral side of the tendon 3 mg dexamethasone Kenalog 5 mg Xylocaine and advised on reduced activities

## 2015-03-21 ENCOUNTER — Ambulatory Visit: Payer: Medicare Other | Admitting: Neurology

## 2015-05-02 DEATH — deceased

## 2016-10-07 ENCOUNTER — Encounter: Payer: Self-pay | Admitting: Internal Medicine

## 2016-10-20 IMAGING — CT CT ABD-PELV W/ CM
1 of 3 series · 14 of 32 positions shown, 19 images · IV contrast (OMNIPAQUE 300)
Comparison: 12/27/2013

CLINICAL DATA: History of diverticulitis. Mid abdominal pain.
Recent bladder scope due to air coming out during urination.

EXAM:
CT ABDOMEN AND PELVIS WITH CONTRAST
TECHNIQUE: Multidetector CT imaging of the abdomen and pelvis was performed
using the standard protocol following bolus administration of
intravenous contrast.
CONTRAST:  50mL OMNIPAQUE IOHEXOL 300 MG/ML SOLN, 80mL OMNIPAQUE
IOHEXOL 300 MG/ML SOLN

[Series 2: abd/pel with · axial · 0.88mm/px · z∈[-431,-16]mm · 14 of 95 slices shown, 19 images]
[im 6/95  soft-tissue]
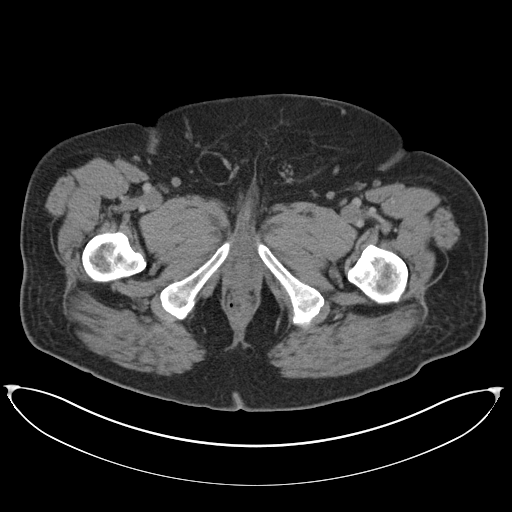
[im 6/95  bone]
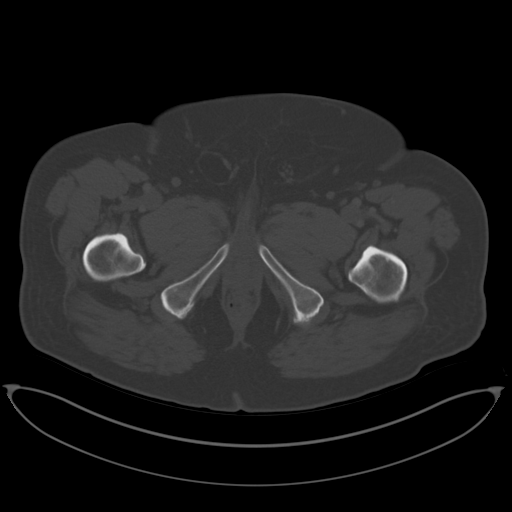
[im 11/95  soft-tissue]
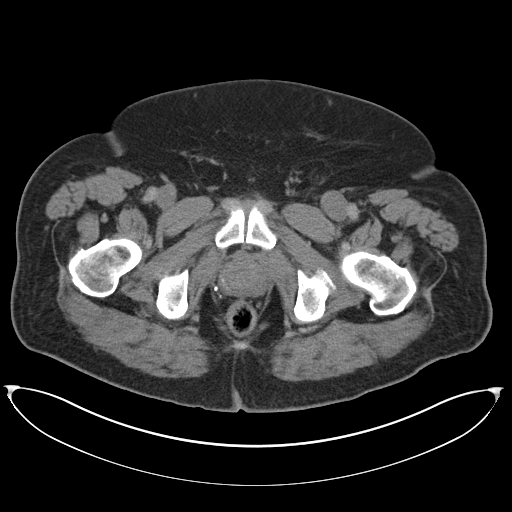
[im 21/95  soft-tissue]
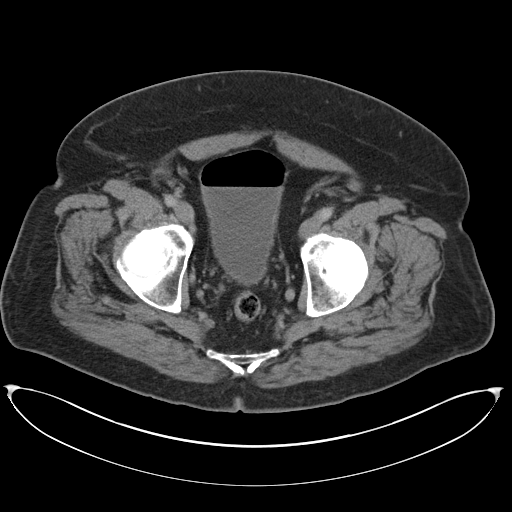
[im 27/95  soft-tissue]
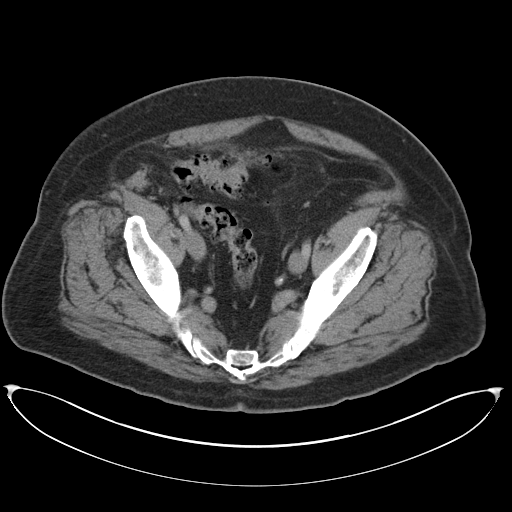
[im 32/95  soft-tissue]
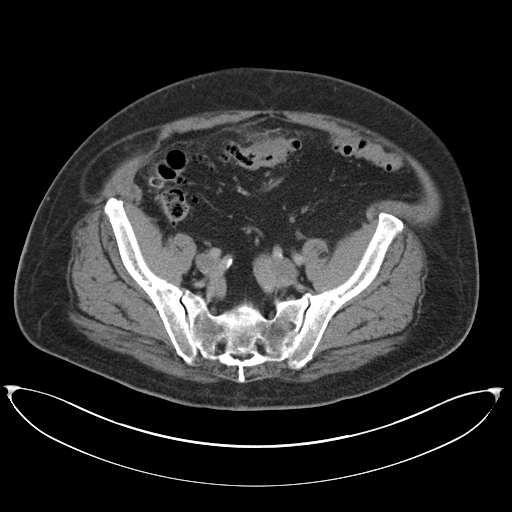
[im 42/95  soft-tissue]
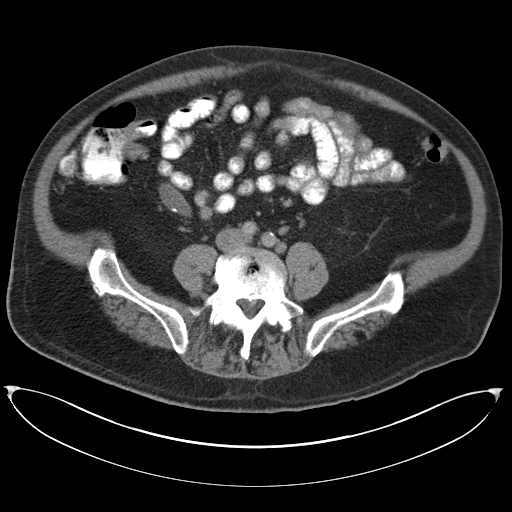
[im 48/95  soft-tissue]
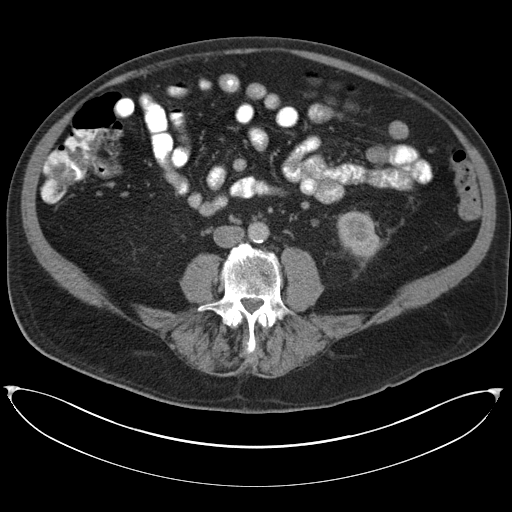
[im 53/95  soft-tissue]
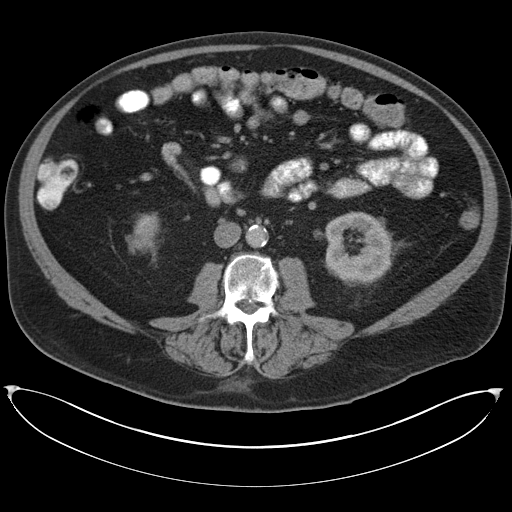
[im 63/95  soft-tissue]
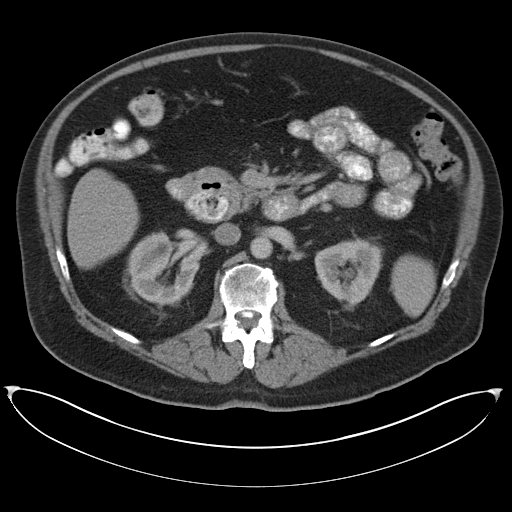
[im 63/95  bone]
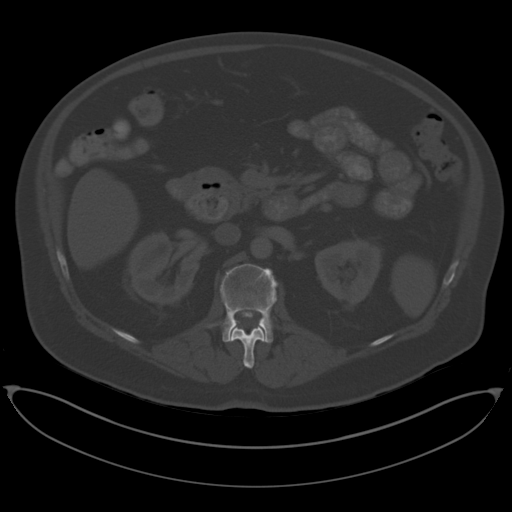
[im 68/95  soft-tissue]
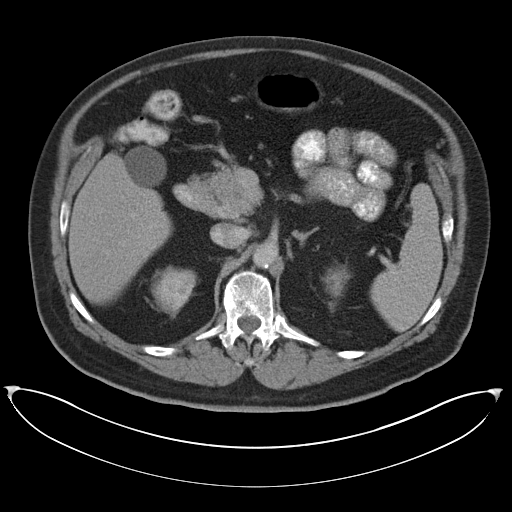
[im 74/95  soft-tissue]
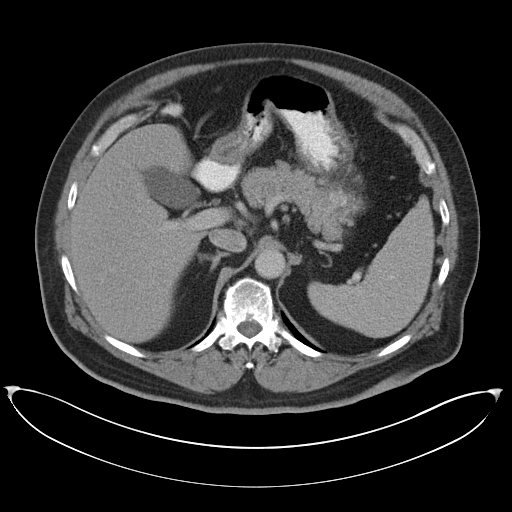
[im 74/95  lung]
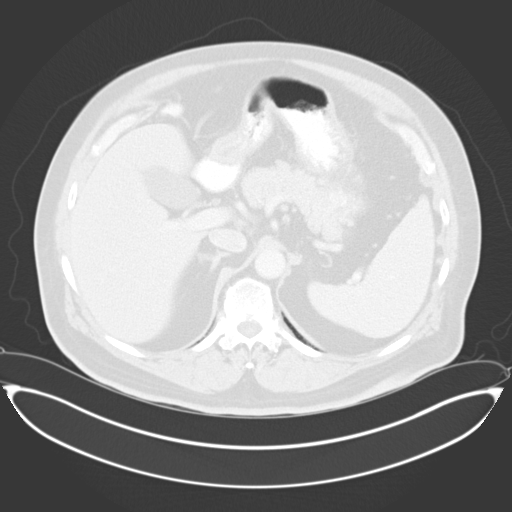
[im 79/95  lung]
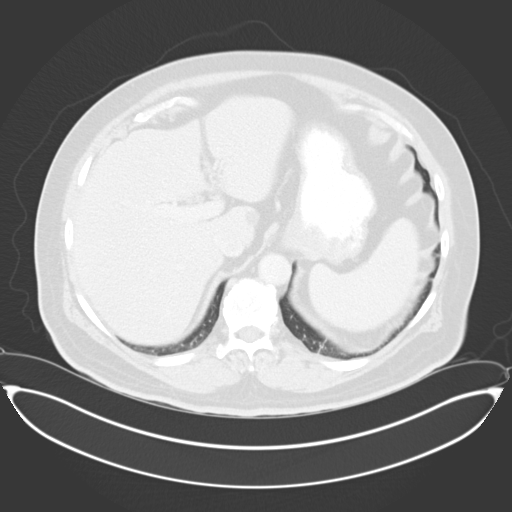
[im 84/95  soft-tissue]
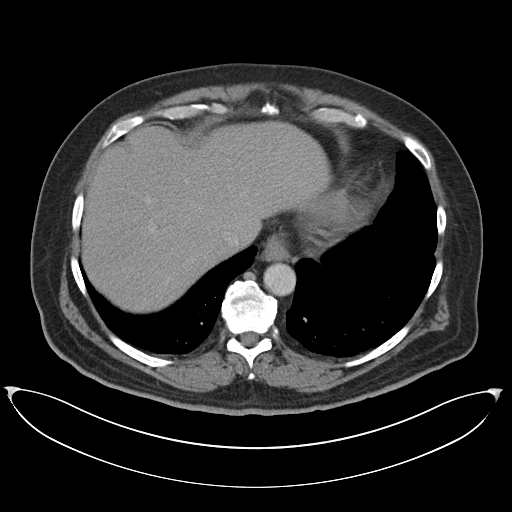
[im 84/95  lung]
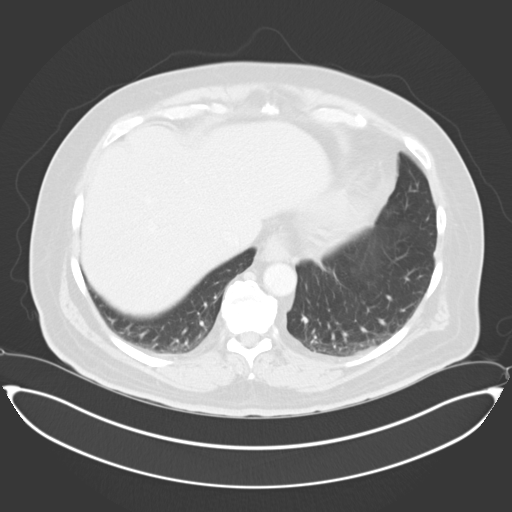
[im 89/95  soft-tissue]
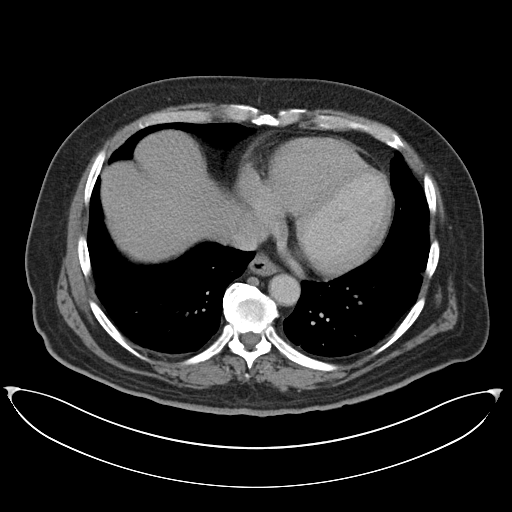
[im 89/95  lung]
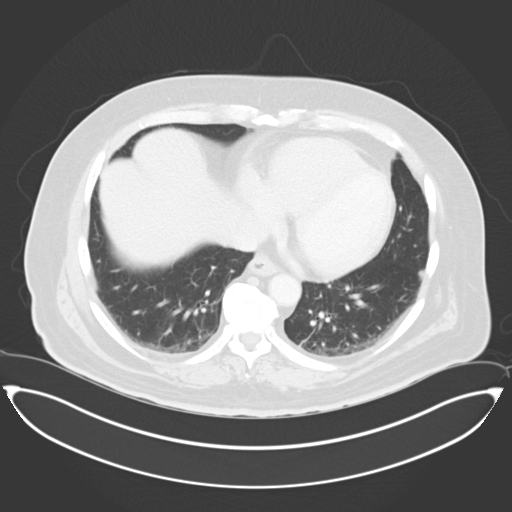

[14 of 32 positions shown; findings below may reference images not displayed]

FINDINGS: Minimal dependent atelectasis in the lung bases. Heart is borderline
in size. No effusions.

Gallstone noted within the gallbladder, stable. Small low-density
lesion within the caudate lobe of the liver, stable, compatible with
cyst. Spleen, pancreas, adrenals are unremarkable.

5 mm nonobstructing stone in the midpole of the right kidney,
stable. Punctate calcification in the left hilum is felt to be
vascular rather than within the urinary tract. No ureteral stones or
hydronephrosis.

Gas is noted within the urinary bladder, similar to prior study.
Inflammatory change noted around the adjacent sigmoid colon with
diverticulosis. Findings suspicious for active diverticulitis and
associated colovesical fistula. No focal fluid collection to suggest
abscess.

Stomach and small bowel are decompressed, unremarkable. No free
fluid or free air.

Aorta is calcified, non aneurysmal. Bilateral scratch has small
bilateral inguinal hernias containing fat. No acute bony
abnormality. Degenerative changes in the thoracolumbar spine.
IMPRESSION: Sigmoid diverticulosis with inflammatory changes around the sigmoid
colon compatible with active diverticulitis. There is into mid
association of the sigmoid colon with the urinary bladder with gas
in the bladder. Findings suggestive of colovesical fistula. This
appearance is stable since prior study. The inflammation around the
sigmoid colon has increased since prior study.

Stable right nephrolithiasis.
